# Patient Record
Sex: Female | Born: 1985 | Race: Black or African American | Hispanic: No | Marital: Single | Smoking: Never smoker
Health system: Southern US, Community
[De-identification: ages and names within clinical notes are randomized; demographics above are authoritative.]

## PROBLEM LIST (undated history)

## (undated) DIAGNOSIS — J45909 Unspecified asthma, uncomplicated: Secondary | ICD-10-CM

## (undated) DIAGNOSIS — R7309 Other abnormal glucose: Secondary | ICD-10-CM

## (undated) DIAGNOSIS — G43909 Migraine, unspecified, not intractable, without status migrainosus: Secondary | ICD-10-CM

## (undated) DIAGNOSIS — R945 Abnormal results of liver function studies: Secondary | ICD-10-CM

## (undated) HISTORY — DX: Abnormal results of liver function studies: R94.5

## (undated) HISTORY — PX: WISDOM TOOTH EXTRACTION: SHX21

## (undated) HISTORY — DX: Other abnormal glucose: R73.09

## (undated) HISTORY — DX: Migraine, unspecified, not intractable, without status migrainosus: G43.909

---

## 2005-02-26 ENCOUNTER — Emergency Department (HOSPITAL_COMMUNITY): Admission: EM | Admit: 2005-02-26 | Discharge: 2005-02-26 | Payer: Self-pay | Admitting: Emergency Medicine

## 2006-04-10 ENCOUNTER — Ambulatory Visit: Admission: RE | Admit: 2006-04-10 | Discharge: 2006-04-10 | Payer: Self-pay | Admitting: Emergency Medicine

## 2010-11-06 ENCOUNTER — Other Ambulatory Visit (HOSPITAL_COMMUNITY)
Admission: RE | Admit: 2010-11-06 | Discharge: 2010-11-06 | Disposition: A | Discharge status: Home / Self Care | Payer: Commercial Indemnity | Source: Ambulatory Visit | Attending: Obstetrics and Gynecology | Admitting: Obstetrics and Gynecology

## 2010-11-06 ENCOUNTER — Other Ambulatory Visit (HOSPITAL_COMMUNITY): Payer: Self-pay | Admitting: Obstetrics and Gynecology

## 2010-11-06 DIAGNOSIS — Z01419 Encounter for gynecological examination (general) (routine) without abnormal findings: Secondary | ICD-10-CM | POA: Insufficient documentation

## 2010-11-06 DIAGNOSIS — N926 Irregular menstruation, unspecified: Secondary | ICD-10-CM

## 2010-11-08 ENCOUNTER — Ambulatory Visit (HOSPITAL_COMMUNITY)
Admission: RE | Admit: 2010-11-08 | Discharge: 2010-11-08 | Disposition: A | Discharge status: Home / Self Care | Payer: Commercial Indemnity | Source: Ambulatory Visit | Attending: Obstetrics and Gynecology | Admitting: Obstetrics and Gynecology

## 2010-11-08 DIAGNOSIS — N926 Irregular menstruation, unspecified: Secondary | ICD-10-CM

## 2010-11-08 DIAGNOSIS — N938 Other specified abnormal uterine and vaginal bleeding: Secondary | ICD-10-CM | POA: Insufficient documentation

## 2010-11-08 DIAGNOSIS — N946 Dysmenorrhea, unspecified: Secondary | ICD-10-CM | POA: Insufficient documentation

## 2010-11-08 DIAGNOSIS — N949 Unspecified condition associated with female genital organs and menstrual cycle: Secondary | ICD-10-CM | POA: Insufficient documentation

## 2012-01-28 IMAGING — US US PELVIS COMPLETE
1 series · 14 of 25 positions shown · non-contrast
Comparison: None.

CLINICAL DATA: Dysfunctional uterine bleeding, dysmenorrhea

TRANSABDOMINAL ULTRASOUND OF PELVIS
TECHNIQUE: Transabdominal ultrasound examination of the pelvis was
performed including evaluation of the uterus, ovaries, adnexal
regions, and pelvic cul-de-sac.

[Series 1: us pelvis complete · 14 of 39 slices shown]
[im 1/39]
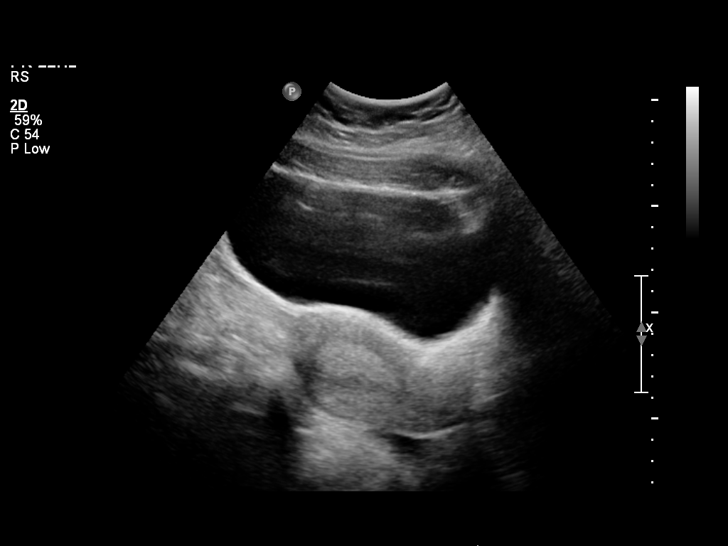
[im 4/39]
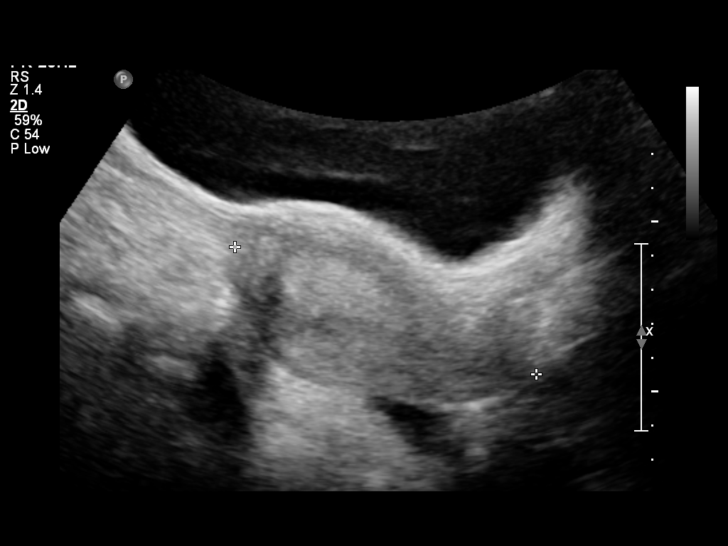
[im 7/39]
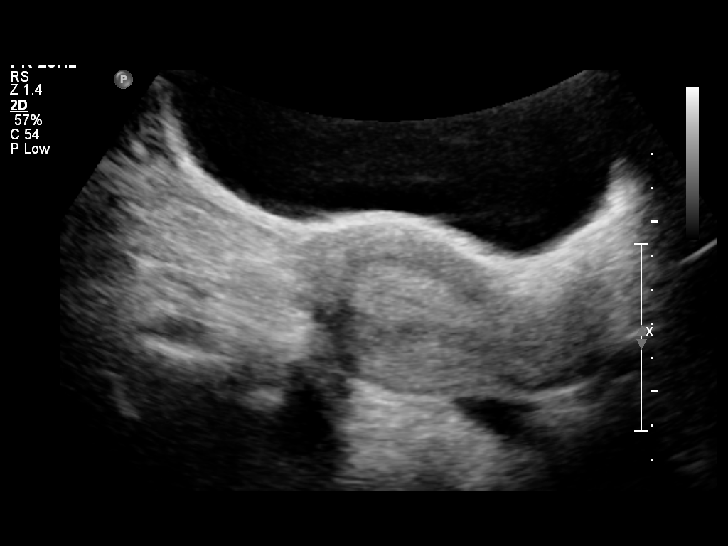
[im 10/39]
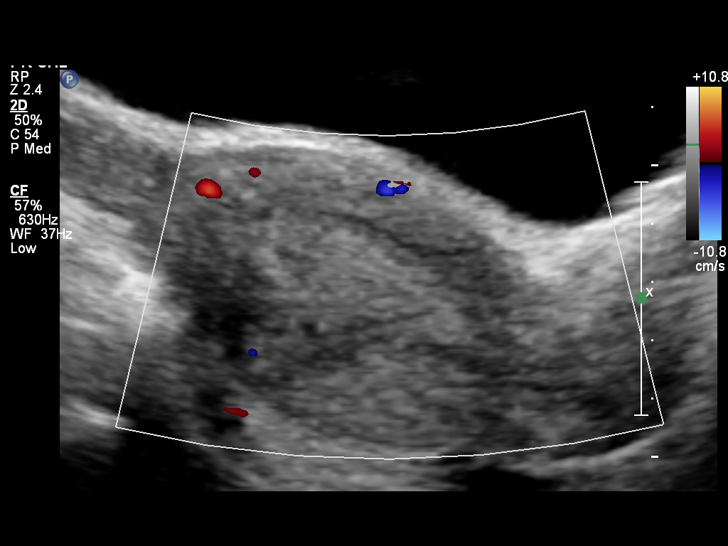
[im 13/39]
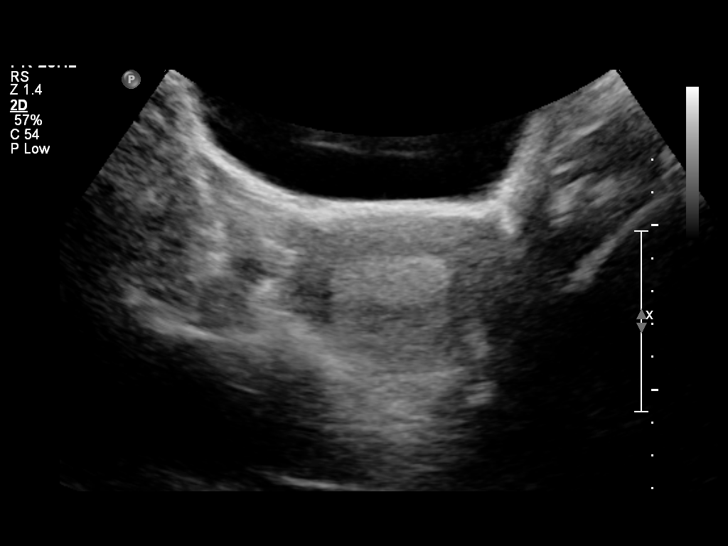
[im 15/39]
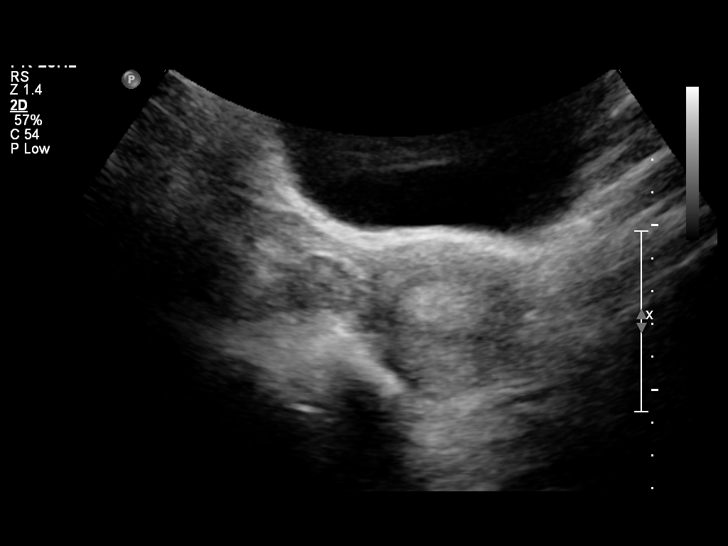
[im 18/39]
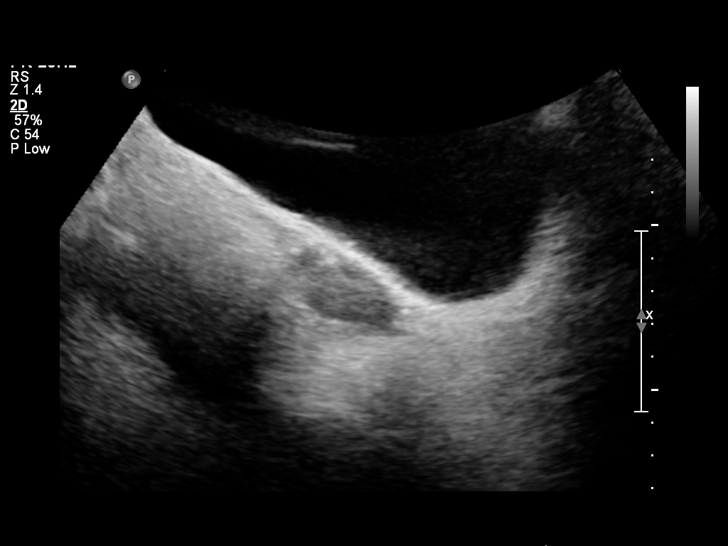
[im 21/39]
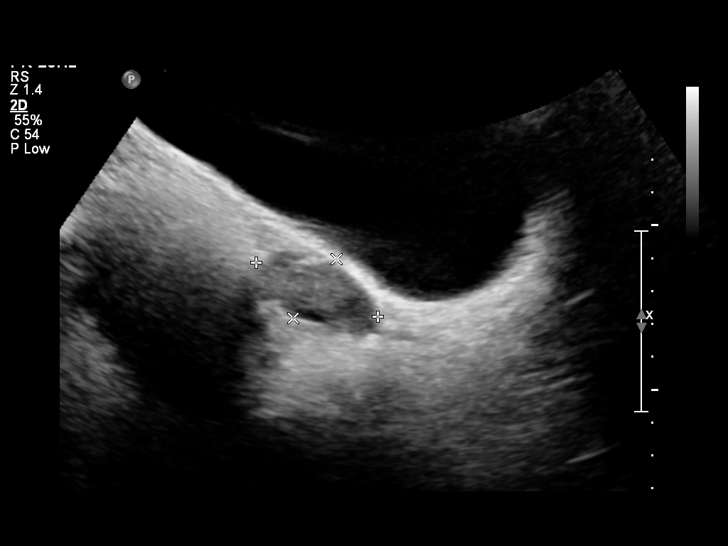
[im 24/39]
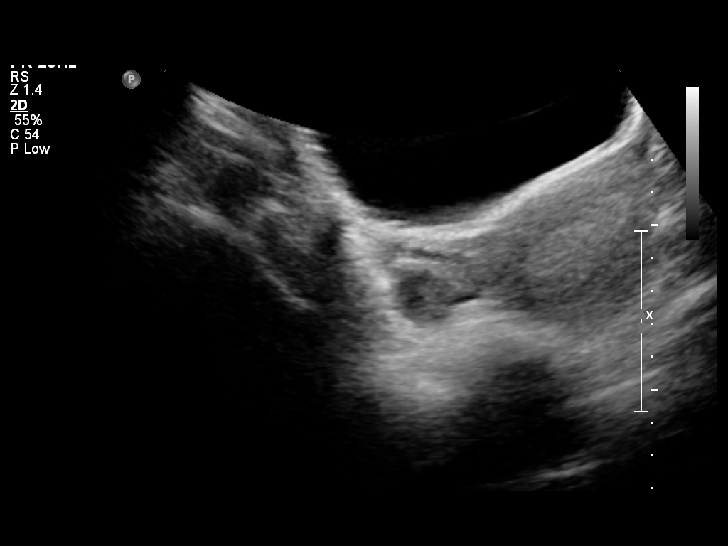
[im 26/39]
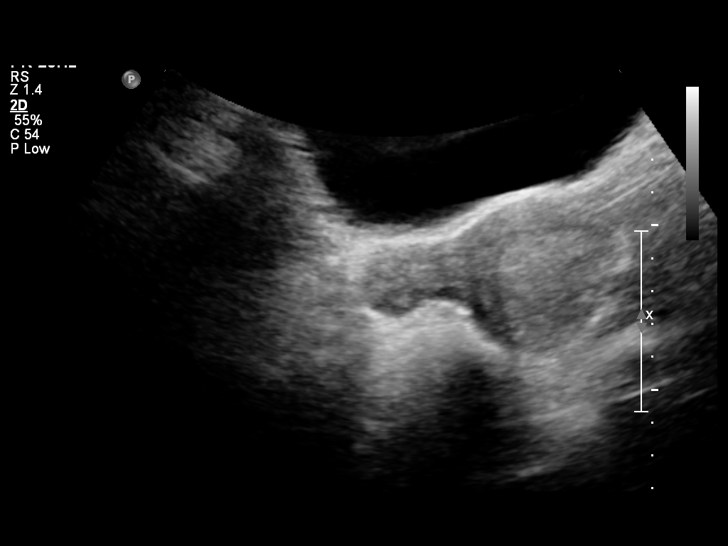
[im 29/39]
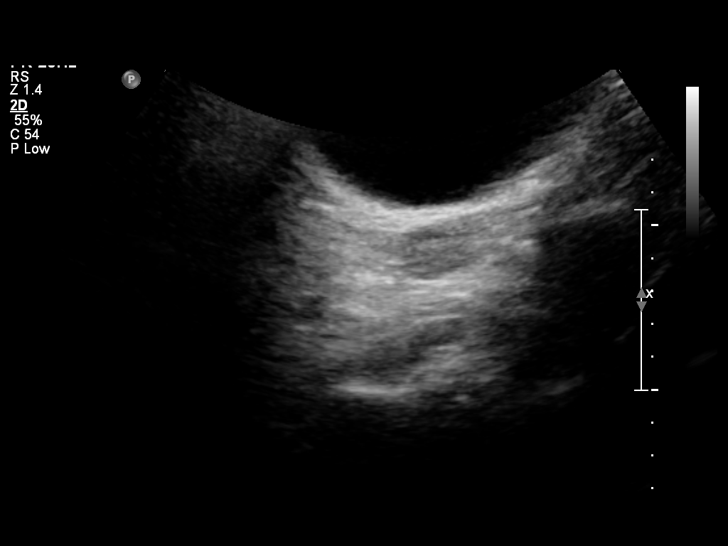
[im 32/39]
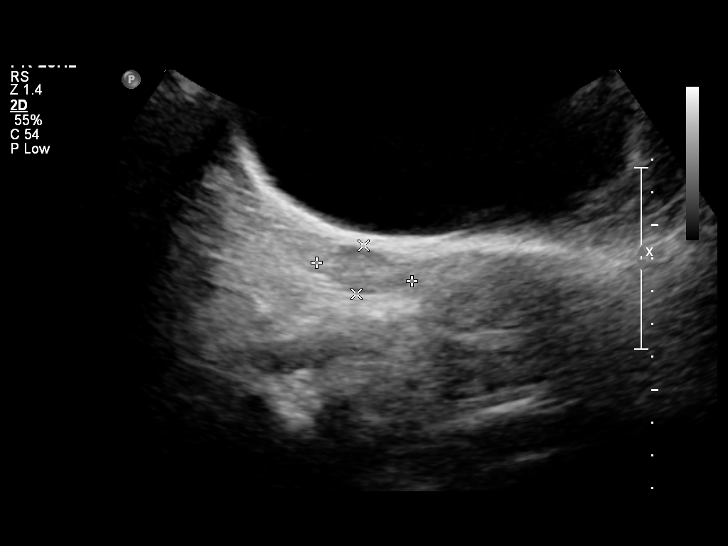
[im 35/39]
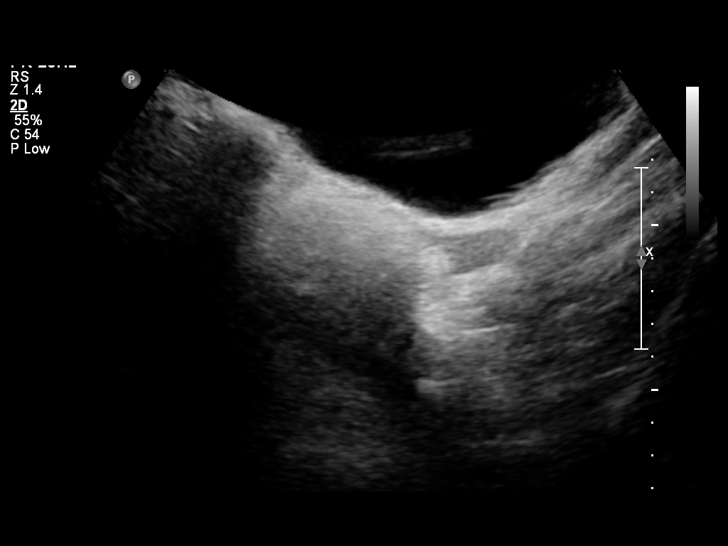
[im 39/39]
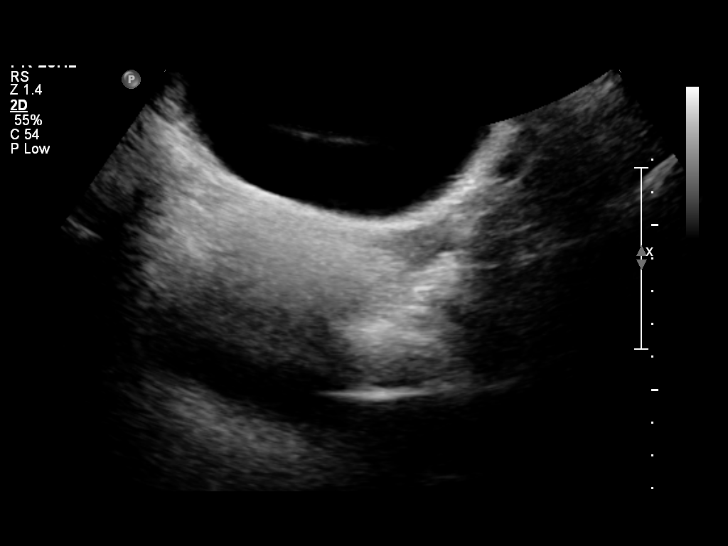

[14 of 25 positions shown; findings below may reference images not displayed]

FINDINGS: Uterus 9.6 x 7.5 x 5.1 cm.  Anteverted, anteflexed.  No focal
abnormality.

Endometrium 1.7 cm.  Uniformly echogenic without focal abnormality.

Right Ovary 4.1 x 3.4 x 2.2 cm.  Normal.

Left Ovary 3.0 x 2.1 x 1.5 cm.  Normal.

Other Findings:  No free fluid.
IMPRESSION: Normal exam.  Transvaginal technique was not used because the
patient is not sexually active.  This could lower the sensitivity
for detection of focal abnormalities in the endometrium.

## 2014-10-20 ENCOUNTER — Encounter: Payer: Self-pay | Admitting: Certified Nurse Midwife

## 2014-10-20 ENCOUNTER — Ambulatory Visit (INDEPENDENT_AMBULATORY_CARE_PROVIDER_SITE_OTHER): Payer: Commercial Indemnity | Admitting: Certified Nurse Midwife

## 2014-10-20 VITALS — BP 124/82 | HR 70 | Resp 20 | Ht 73.5 in | Wt >= 6400 oz

## 2014-10-20 DIAGNOSIS — D649 Anemia, unspecified: Secondary | ICD-10-CM | POA: Diagnosis not present

## 2014-10-20 DIAGNOSIS — Z01419 Encounter for gynecological examination (general) (routine) without abnormal findings: Secondary | ICD-10-CM | POA: Diagnosis not present

## 2014-10-20 DIAGNOSIS — N938 Other specified abnormal uterine and vaginal bleeding: Secondary | ICD-10-CM | POA: Diagnosis not present

## 2014-10-20 DIAGNOSIS — Z124 Encounter for screening for malignant neoplasm of cervix: Secondary | ICD-10-CM

## 2014-10-20 DIAGNOSIS — Z0000 Encounter for general adult medical examination without abnormal findings: Secondary | ICD-10-CM

## 2014-10-20 MED ORDER — FUSION PLUS PO CAPS: 1.0000 | ORAL_CAPSULE | Freq: Two times a day (BID) | ORAL | Status: DC

## 2014-10-20 NOTE — Progress Notes (Signed)
29 y.o. G0P0000 Single  African American Fe here to establish gyn care and for annual exam. Periods have been very irregular in the past year. 2015 periods 1/15,/3/15,5/15, 8/15/, 04-20-14 with bleeding until 08/05/14, heavy to moderate. Started again 08-08-14 and bleeding scant amount  now. Denies fatigue or faintness, drinking good fluid intake daily. Never sexually active. Previous periods all normal, just cramping. Saw PCP in the past year for labs/ aex, per patient all normal. Patient has been trying to lose weight, but feels she has gained weight. Patient is ready for this to stop. Patient has migraine headaches but no migraine with aura.Patient had previously been very athletic to maintain weight, but works in sedentary job now. No other health concerns today.  Patient's last menstrual period was 04/20/2014.          Sexually active: No. The current method of family planning is abstinence.    Exercising: Yes.    walking 2 miles 5 days a week Smoker:  no  Health Maintenance: Pap:  2-85yrs ago neg per patient MMG:  none Colonoscopy:  none BMD:   none TDaP:  Within 8yrs Labs: Hgb-9.6 Self breast exam: done monthly   reports that she has never smoked. She does not have any smokeless tobacco history on file. She reports that she drinks alcohol. She reports that she does not use illicit drugs.  Past Medical History  Diagnosis Date  . Migraines     Past Surgical History  Procedure Laterality Date  . Wisdom tooth extraction      Current Outpatient Prescriptions  Medication Sig Dispense Refill  . Ibuprofen (ADVIL MIGRAINE PO) Take by mouth as needed.    Marland Kitchen UNABLE TO FIND as needed. Sudafed sinus pressure and pain     No current facility-administered medications for this visit.    Family History  Problem Relation Age of Onset  . Pancreatic cancer Mother   . Diabetes Mother   . Hypertension Paternal Grandmother   . Stroke Paternal Grandfather     ROS:  Pertinent items are noted  in HPI.  Otherwise, a comprehensive ROS was negative.  Exam:   BP 124/82 mmHg  Pulse 70  Resp 20  Ht 6' 1.5" (1.867 m)  Wt 409 lb (185.521 kg)  BMI 53.22 kg/m2  LMP 04/20/2014 Height: 6' 1.5" (186.7 cm) Ht Readings from Last 3 Encounters:  10/20/14 6' 1.5" (1.867 m)    General appearance: alert, cooperative and appears stated age Head: Normocephalic, without obvious abnormality, atraumatic Neck: no adenopathy, supple, symmetrical, trachea midline and thyroid normal to inspection and palpation Lungs: clear to auscultation bilaterally Breasts: normal appearance, no masses or tenderness, No nipple retraction or dimpling, No nipple discharge or bleeding, No axillary or supraclavicular adenopathy Heart: regular rate and rhythm Abdomen: soft, non-tender; no masses,  no organomegaly Extremities: extremities normal, atraumatic, no cyanosis or edema Skin: Skin color, texture, turgor normal. No rashes or lesions Lymph nodes: Cervical, supraclavicular, and axillary nodes normal. No abnormal inguinal nodes palpated Neurologic: Grossly normal   Pelvic: External genitalia:  no lesions              Urethra:  normal appearing urethra with no masses, tenderness or lesions              Bartholin's and Skene's: normal                 Vagina: normal appearing vagina with normal color and discharge, no lesions, virginal introitus  Cervix: normal,non tender, no lesions posterior              Pap taken: Yes.   Bimanual Exam:  Uterus: anterior  normal, no masses noted, limited due to body habitus              Adnexa: normal adnexa, no mass, fullness, tenderness and adnexa not palpable due to body habitus, but no large masses felt               Rectovaginal: Confirms               Anus:  normal appearance  Chaperone present: Yes  A:  Well Woman with normal exam  Contraception none needed never sexually active  DUB ? Related to weight change  Anemia probably due to DUB  Morbid  obesity  Limited pelvic exam due to body habitus    P:   Reviewed health and wellness pertinent to exam  Discussed limited pelvic exam but no large masses noted. Discussed DUB can be related to weight change, Thyroid changes or other problems. Would recommend lab for Thyroid and PUS to evaluate for other problems and confirm normal exam. Patient agreeable and is aware she will be called with insurance information and scheduled. Continue to keep menses calendar and advise if having warning signs with bleeding.. Discussed also management of DUB with contraception options of OCP, Nuvaring,IUD, POP.Discussed risk and benefits, expectations, insertion/removal. Patient would open to OCP or POP at this point, but would like to think about. Will discuss further once PUS completed. Questions addressed.  Lab TSH  Discussed anemia finding and need for Rx iron, patient agreeable. Discussed increasing iron foods in diet also and importance of limiting tea, which inhibits absorption., given food list. Take Fusion Plus with Vitamin C source. Questions addressed.  Rx Fusion Plus see order  Lab: Iron ,TIBC, Ferritin ,CBC  Recheck in one month with OV if needed.  Pap smear taken with HPV reflex   counseled on breast self exam, STD prevention, HIV risk factors and prevention, family planning choices, adequate intake of calcium and vitamin D., Discussed diet and exercise to achieve normal weight and prevent other health problems . return annually or prn, as above  12 minutes spent with patient in face to face counseling regarding anemia and management.   An After Visit Summary was printed and given to the patient.

## 2014-10-20 NOTE — Patient Instructions (Addendum)
EXERCISE AND DIET:  We recommended that you start or continue a regular exercise program for good health. Regular exercise means any activity that makes your heart beat faster and makes you sweat.  We recommend exercising at least 30 minutes per day at least 3 days a week, preferably 4 or 5.  We also recommend a diet low in fat and sugar.  Inactivity, poor dietary choices and obesity can cause diabetes, heart attack, stroke, and kidney damage, among others.    ALCOHOL AND SMOKING:  Women should limit their alcohol intake to no more than 7 drinks/beers/glasses of wine (combined, not each!) per week. Moderation of alcohol intake to this level decreases your risk of breast cancer and liver damage. And of course, no recreational drugs are part of a healthy lifestyle.  And absolutely no smoking or even second hand smoke. Most people know smoking can cause heart and lung diseases, but did you know it also contributes to weakening of your bones? Aging of your skin?  Yellowing of your teeth and nails?  CALCIUM AND VITAMIN D:  Adequate intake of calcium and Vitamin D are recommended.  The recommendations for exact amounts of these supplements seem to change often, but generally speaking 600 mg of calcium (either carbonate or citrate) and 800 units of Vitamin D per day seems prudent. Certain women may benefit from higher intake of Vitamin D.  If you are among these women, your doctor will have told you during your visit.    PAP SMEARS:  Pap smears, to check for cervical cancer or precancers,  have traditionally been done yearly, although recent scientific advances have shown that most women can have pap smears less often.  However, every woman still should have a physical exam from her gynecologist every year. It will include a breast check, inspection of the vulva and vagina to check for abnormal growths or skin changes, a visual exam of the cervix, and then an exam to evaluate the size and shape of the uterus and  ovaries.  And after 30 years of age, a rectal exam is indicated to check for rectal cancers. We will also provide age appropriate advice regarding health maintenance, like when you should have certain vaccines, screening for sexually transmitted diseases, bone density testing, colonoscopy, mammograms, etc.   MAMMOGRAMS:  All women over 40 years old should have a yearly mammogram. Many facilities now offer a "3D" mammogram, which may cost around $50 extra out of pocket. If possible,  we recommend you accept the option to have the 3D mammogram performed.  It both reduces the number of women who will be called back for extra views which then turn out to be normal, and it is better than the routine mammogram at detecting truly abnormal areas.    COLONOSCOPY:  Colonoscopy to screen for colon cancer is recommended for all women at age 50.  We know, you hate the idea of the prep.  We agree, BUT, having colon cancer and not knowing it is worse!!  Colon cancer so often starts as a polyp that can be seen and removed at colonscopy, which can quite literally save your life!  And if your first colonoscopy is normal and you have no family history of colon cancer, most women don't have to have it again for 10 years.  Once every ten years, you can do something that may end up saving your life, right?  We will be happy to help you get it scheduled when you are ready.    Be sure to check your insurance coverage so you understand how much it will cost.  It may be covered as a preventative service at no cost, but you should check your particular policyIron Deficiency Anemia Anemia is a condition in which there are less red blood cells or hemoglobin in the blood than normal. Hemoglobin is the part of red blood cells that carries oxygen. Iron deficiency anemia is anemia caused by too little iron. It is the most common type of anemia. It may leave you tired and short of breath. CAUSES   Lack of iron in the diet.  Poor absorption  of iron, as seen with intestinal disorders.  Intestinal bleeding.  Heavy periods. SIGNS AND SYMPTOMS  Mild anemia may not be noticeable. Symptoms may include:  Fatigue.  Headache.  Pale skin.  Weakness.  Tiredness.  Shortness of breath.  Dizziness.  Cold hands and feet.  Fast or irregular heartbeat. DIAGNOSIS  Diagnosis requires a thorough evaluation and physical exam by your health care provider. Blood tests are generally used to confirm iron deficiency anemia. Additional tests may be done to find the underlying cause of your anemia. These may include:  Testing for blood in the stool (fecal occult blood test).  A procedure to see inside the colon and rectum (colonoscopy).  A procedure to see inside the esophagus and stomach (endoscopy). TREATMENT  Iron deficiency anemia is treated by correcting the cause of the deficiency. Treatment may involve:  Adding iron-rich foods to your diet.  Taking iron supplements. Pregnant or breastfeeding women need to take extra iron because their normal diet usually does not provide the required amount.  Taking vitamins. Vitamin C improves the absorption of iron. Your health care provider may recommend that you take your iron tablets with a glass of orange juice or vitamin C supplement.  Medicines to make heavy menstrual flow lighter.  Surgery. HOME CARE INSTRUCTIONS   Take iron as directed by your health care provider.  If you cannot tolerate taking iron supplements by mouth, talk to your health care provider about taking them through a vein (intravenously) or an injection into a muscle.  For the best iron absorption, iron supplements should be taken on an empty stomach. If you cannot tolerate them on an empty stomach, you may need to take them with food.  Do not drink milk or take antacids at the same time as your iron supplements. Milk and antacids may interfere with the absorption of iron.  Iron supplements can cause  constipation. Make sure to include fiber in your diet to prevent constipation. A stool softener may also be recommended.  Take vitamins as directed by your health care provider.  Eat a diet rich in iron. Foods high in iron include liver, lean beef, whole-grain bread, eggs, dried fruit, and dark [place] leafy vegetables. SEEK IMMEDIATE MEDICAL CARE IF:   You faint. If this happens, do not drive. Call your local emergency services (911 in [place] if no other help is available.  You have chest pain.  You feel nauseous or vomit.  You have severe or increased shortness of breath with activity.  You feel weak.  You have a rapid heartbeat.  You have unexplained sweating.  You become light-headed when getting up from a chair or bed. MAKE SURE YOU:   Understand these instructions.  Will watch your condition.  Will get help right away if you are not doing well or get worse. Document Released: 05/11/2000 Document Revised: 05/19/2013 Document Reviewed: 01/19/2013 ExitCare   Patient Information 2015 [employer]. This information is not intended to replace advice given to you by your health care provider. Make sure you discuss any questions you have with your health care provider.  

## 2014-10-21 ENCOUNTER — Telehealth: Payer: Self-pay

## 2014-10-21 LAB — IRON: Iron: 24 ug/dL — ABNORMAL LOW (ref 42–145)

## 2014-10-21 LAB — IPS PAP TEST WITH REFLEX TO HPV

## 2014-10-21 LAB — IBC PANEL
%SAT: 6 % — ABNORMAL LOW (ref 20–55)
TIBC: 419 ug/dL (ref 250–470)
UIBC: 395 ug/dL (ref 125–400)

## 2014-10-21 LAB — CBC
HCT: 31.5 % — ABNORMAL LOW (ref 36.0–46.0)
Hemoglobin: 9.8 g/dL — ABNORMAL LOW (ref 12.0–15.0)
MCH: 25.5 pg — ABNORMAL LOW (ref 26.0–34.0)
MCHC: 31.1 g/dL (ref 30.0–36.0)
MCV: 82 fL (ref 78.0–100.0)
MPV: 9.5 fL (ref 8.6–12.4)
Platelets: 451 10*3/uL — ABNORMAL HIGH (ref 150–400)
RBC: 3.84 MIL/uL — ABNORMAL LOW (ref 3.87–5.11)
RDW: 14.1 % (ref 11.5–15.5)
WBC: 10.1 10*3/uL (ref 4.0–10.5)

## 2014-10-21 LAB — FERRITIN: Ferritin: 11 ng/mL (ref 10–291)

## 2014-10-21 LAB — TSH: TSH: 2.224 u[IU]/mL (ref 0.350–4.500)

## 2014-10-21 NOTE — Telephone Encounter (Signed)
-----   Message from Vickie Mejia, CNM sent at 10/21/2014  7:38 AM EDT ----- Notify patient that Ferritin level is borderline low, but normal Iron level is low at 24, normal 42-145 Saturation level is low at 6 normal 20-55% CBC shows low hgb profile with anemia Take Fusion Plus as prescribed and work on iron foods in diet. TSH is normal no problems that would cause bleeding change. Feel she needs PUS to assure no other problems, she will be contacted by our office with insurance information and scheduled as we talked about if needed.

## 2014-10-21 NOTE — Telephone Encounter (Signed)
lmtcb

## 2014-10-22 ENCOUNTER — Telehealth: Payer: Self-pay

## 2014-10-22 ENCOUNTER — Telehealth: Payer: Self-pay | Admitting: Obstetrics and Gynecology

## 2014-10-22 NOTE — Telephone Encounter (Signed)
Call to patient. Advised of benefit quote received for PUS. °Patient agreeable. Scheduled PUS. °Advised patient of 72 hour cancellation policy and $100 cancellation fee. Patient agreeable. °

## 2014-10-22 NOTE — Addendum Note (Signed)
Addended by: Joeseph AmorFAST, Nahshon Reich L on: 10/22/2014 11:47 AM   Modules accepted: Orders

## 2014-10-22 NOTE — Telephone Encounter (Signed)
lmtcb to give lab results. 

## 2014-10-22 NOTE — Telephone Encounter (Signed)
-----   Message from Verner Choleborah S Leonard, CNM sent at 10/21/2014  5:55 PM EDT ----- Pap smear negative no endos 02

## 2014-10-22 NOTE — Telephone Encounter (Signed)
Patient returned call to reschedule. Original appointment canceled.

## 2014-10-22 NOTE — Telephone Encounter (Signed)
Patient calling back to see if there was an earlier ultrasound appointment time on June 2nd

## 2014-10-22 NOTE — Progress Notes (Signed)
Reviewed personally.  M. Suzanne Abraham Margulies, MD.  

## 2014-10-22 NOTE — Telephone Encounter (Signed)
lmtcb

## 2014-10-22 NOTE — Telephone Encounter (Signed)
Called patient. Left detailed message okay per designated party release form that there are no earlier available appointments on 10/28/14 for ultrasound. Advised if needs to change appointment to please call back as soon as possible due to 72 cancellation requirement. Advised can speak with nurse or Sabrina to reschedule appointment if needed.    cc Cathrine MusterSabrina Franklin

## 2014-10-22 NOTE — Telephone Encounter (Signed)
Left message for patient to call back and reschedule PUS. °

## 2014-10-22 NOTE — Telephone Encounter (Signed)
-----   Message from Deborah S Leonard, CNM sent at 10/21/2014  7:38 AM EDT ----- Notify patient that Ferritin level is borderline low, but normal Iron level is low at 24, normal 42-145 Saturation level is low at 6 normal 20-55% CBC shows low hgb profile with anemia Take Fusion Plus as prescribed and work on iron foods in diet. TSH is normal no problems that would cause bleeding change. Feel she needs PUS to assure no other problems, she will be contacted by our office with insurance information and scheduled as we talked about if needed.  

## 2014-10-26 LAB — HEMOGLOBIN, FINGERSTICK: Hemoglobin, fingerstick: 9.6 g/dL — ABNORMAL LOW (ref 12.0–16.0)

## 2014-10-26 NOTE — Telephone Encounter (Signed)
Call to patient to reschedule PUS. Per message "The person you have called cannot accept calls at this time. We apologize for any inconvenience that this may cause."

## 2014-10-26 NOTE — Telephone Encounter (Signed)
Unable to leave message. Try again. 

## 2014-10-28 ENCOUNTER — Other Ambulatory Visit: Payer: Commercial Indemnity

## 2014-10-28 ENCOUNTER — Other Ambulatory Visit: Payer: Commercial Indemnity | Admitting: Obstetrics and Gynecology

## 2014-10-29 NOTE — Telephone Encounter (Signed)
Pt called back. °

## 2014-10-29 NOTE — Telephone Encounter (Signed)
Left message to call back  

## 2014-10-29 NOTE — Telephone Encounter (Signed)
Left message for call back.

## 2014-11-01 NOTE — Telephone Encounter (Signed)
Patient notified of results by leaving a detailed message on voicemail. See lab

## 2014-11-02 NOTE — Telephone Encounter (Signed)
Left message for patient to call back. Need to reschedule PUS °

## 2014-11-03 NOTE — Telephone Encounter (Signed)
Returned call to patient. Rescheduled PUS.  I reiterated benefit information. Patient agreeable. Advised patient of 72 hour cancellation policy and $100 cancellation fee. Patient agreeable.

## 2014-11-03 NOTE — Telephone Encounter (Signed)
Patient is ready to schedule her PUS appointment.  °

## 2014-11-18 ENCOUNTER — Ambulatory Visit (INDEPENDENT_AMBULATORY_CARE_PROVIDER_SITE_OTHER): Payer: Commercial Indemnity | Admitting: Obstetrics and Gynecology

## 2014-11-18 ENCOUNTER — Ambulatory Visit (INDEPENDENT_AMBULATORY_CARE_PROVIDER_SITE_OTHER): Payer: Commercial Indemnity

## 2014-11-18 ENCOUNTER — Encounter: Payer: Self-pay | Admitting: Obstetrics and Gynecology

## 2014-11-18 ENCOUNTER — Other Ambulatory Visit: Payer: Self-pay | Admitting: Obstetrics and Gynecology

## 2014-11-18 VITALS — BP 122/80 | Ht 73.5 in | Wt >= 6400 oz

## 2014-11-18 DIAGNOSIS — N938 Other specified abnormal uterine and vaginal bleeding: Secondary | ICD-10-CM

## 2014-11-18 DIAGNOSIS — N921 Excessive and frequent menstruation with irregular cycle: Secondary | ICD-10-CM

## 2014-11-18 MED ORDER — NORETHINDRONE 0.35 MG PO TABS: 1.0000 | ORAL_TABLET | Freq: Every day | ORAL | Status: DC

## 2014-11-18 MED ORDER — NORETHINDRONE ACETATE 5 MG PO TABS: 5.0000 mg | ORAL_TABLET | Freq: Every day | ORAL | Status: DC

## 2014-11-18 NOTE — Patient Instructions (Signed)
If you would like, you may attend a free seminar at [facility] regarding weight loss surgery.

## 2014-11-18 NOTE — Progress Notes (Signed)
Patient ID: Vickie Mejia, female   DOB: Jun 22, 1985, 29 y.o.   MRN: [id number]  Subjective  29 y.o. G0P0000  African American female here for pelvic ultrasound for pelvic ultrasound for irregular and heavy menses with anemia.   Skipped menses for months.  Menses returned in November 2015.  Bled from November 2015 to March 2016.  Stopped for a few days and then restarted from March 2016 until now.  Bleeding is clotting 2 - 3 times a day.  No pain.  Denies dizziness, lightheadedness, SOB, or palpitations. Notes weight gain.   Has taken some Advil to control bleeding an pain.  She has not taken Provera or progesterone to date.   Has an appointment for lab visit on July 13 for a recheck of her anemia.  Had normal TSH.  Having migraine headaches.  Does not have aura.  States borderline HTN.  Not sexually active now or in the past.  Interested in future childbearing.   Objective  Pelvic ultrasound images and report reviewed with patient.  Uterus - No masses. EMS - 12.18. No feeder vessels. Ovaries - normal Free fluid - no  Assessment  Menorrhagia with irregular menses. I suspect anovulatory bleeding.  Obesity.  Plan  Discussion of anovulatory bleeding and importance of treatment for prevention of endometrial hyperplasia and endometrial cancer.  Aygestin 5 mg daily for 14 days.  Have withdrawal bleeding.  Then start Micronor after 7th day of cycle.  Instructed in use.  Follow up for recheck of blood counts in about 3 weeks.  Follow up for recheck in 3 months.  Discussed weight loss surgery and free seminars regarding this at [facility].  ____25___ minutes face to face time of which over 50% was spent in counseling.   After visit summary to patient.

## 2014-11-30 ENCOUNTER — Telehealth: Payer: Self-pay | Admitting: *Deleted

## 2014-11-30 MED ORDER — NORETHINDRONE 0.35 MG PO TABS: 1.0000 | ORAL_TABLET | Freq: Every day | ORAL | Status: DC

## 2014-11-30 NOTE — Telephone Encounter (Signed)
[facility] Norethindrone 0.35 mg Last AEX:  10/20/14 with DL Next AEX: No AEX scheduled for 2017 Last MMG (if hormonal medication request): n/a Refill authorized: ?  11/18/14 [facility] Left Message To Call Back to see which pharmacy patient is needing rx sent to.

## 2014-11-30 NOTE — Telephone Encounter (Signed)
Patient called back [facility] in order for her insurance to pay for her prescription. They need a new prescription in order to intiate her being able to receive her rx's from them.  Norethindrone 0.35 mg #3 packs/0 rfs [facility], patient is aware.

## 2014-12-06 ENCOUNTER — Telehealth: Payer: Self-pay | Admitting: Certified Nurse Midwife

## 2014-12-06 NOTE — Telephone Encounter (Signed)
Patient calling to cancel her 1 month recheck with Leota Sauerseborah Leonard, CNM for 12/08/14. She said she will wait to be seen until her 3 month recheck with Dr. Edward JollySilva for 02/16/15.

## 2014-12-07 NOTE — Telephone Encounter (Signed)
I see from her note that she is to evaluate bleeding status with Micronor use. I will put her on my reminder list to make sure she comes in.

## 2014-12-07 NOTE — Telephone Encounter (Signed)
Routed to DL 

## 2014-12-08 ENCOUNTER — Ambulatory Visit: Payer: Commercial Indemnity | Admitting: Certified Nurse Midwife

## 2015-01-19 NOTE — Telephone Encounter (Signed)
yes

## 2015-01-19 NOTE — Telephone Encounter (Signed)
Patient scheduled 02/16/15 with Dr. Edward Jolly. Okay to close encounter?

## 2015-02-16 ENCOUNTER — Ambulatory Visit (INDEPENDENT_AMBULATORY_CARE_PROVIDER_SITE_OTHER): Payer: Commercial Indemnity | Admitting: Obstetrics and Gynecology

## 2015-02-16 ENCOUNTER — Encounter: Payer: Self-pay | Admitting: Obstetrics and Gynecology

## 2015-02-16 VITALS — BP 118/78 | HR 80 | Ht 73.5 in | Wt >= 6400 oz

## 2015-02-16 DIAGNOSIS — N938 Other specified abnormal uterine and vaginal bleeding: Secondary | ICD-10-CM | POA: Diagnosis not present

## 2015-02-16 DIAGNOSIS — D649 Anemia, unspecified: Secondary | ICD-10-CM

## 2015-02-16 DIAGNOSIS — N926 Irregular menstruation, unspecified: Secondary | ICD-10-CM

## 2015-02-16 LAB — CBC
HCT: 40.1 % (ref 36.0–46.0)
Hemoglobin: 12.9 g/dL (ref 12.0–15.0)
MCH: 26.5 pg (ref 26.0–34.0)
MCHC: 32.2 g/dL (ref 30.0–36.0)
MCV: 82.3 fL (ref 78.0–100.0)
MPV: 10 fL (ref 8.6–12.4)
Platelets: 378 10*3/uL (ref 150–400)
RBC: 4.87 MIL/uL (ref 3.87–5.11)
RDW: 16.7 % — ABNORMAL HIGH (ref 11.5–15.5)
WBC: 10.5 10*3/uL (ref 4.0–10.5)

## 2015-02-16 LAB — HEMOGLOBIN, FINGERSTICK: Hemoglobin, fingerstick: 13 g/dL (ref 12.0–16.0)

## 2015-02-16 NOTE — Progress Notes (Signed)
Patient ID: Vickie Mejia, female   DOB: 29-Mar-1986, 29 y.o.   MRN: [id number] GYNECOLOGY  VISIT   HPI: 29 y.o.   Single  African American  female   G0P0000 with Patient's last menstrual period was 01/27/2015 (exact date).   here for 3 month follow-up after starting Micronor for anovulatory bleeding.  Had a normal pelvic ultrasound.  No EMB due to young age and EMS 12.18 mm. Anemia with low iron and ferritin.  Labs drawn today for a recheck of this.   Took Aygestin for 14 days and then started Micronor.  Bled heavily for one week after finished the Aygestin.  When started Micronor, did skip the last week of the pills and had no cycle. Then read the directions and restarted the Micronor properly for the last 2 months.  No cycle in August.  In September had a cycle for 2.5 weeks.  Cycles were 55 days apart.  Has borderline HTN.   Not sexually active.  Has difficulty with pelvic exams.  HGB:  13.0  GYNECOLOGIC HISTORY: Patient's last menstrual period was 01/27/2015 (exact date). Contraception:Abstinence Menopausal hormone therapy: n/a Last mammogram: n/a Last pap smear: 10-20-14 Neg        OB History    Gravida Para Term Preterm AB TAB SAB Ectopic Multiple Living           There are no active problems to display for this patient.   Past Medical History  Diagnosis Date  . Migraines     Past Surgical History  Procedure Laterality Date  . Wisdom tooth extraction      Current Outpatient Prescriptions  Medication Sig Dispense Refill  . BIOTIN FORTE PO Take 1 tablet by mouth daily.    . Ibuprofen (ADVIL MIGRAINE PO) Take by mouth as needed.    . Iron-FA-B Cmp-C-Biot-Probiotic (FUSION PLUS) CAPS Take 1 capsule by mouth 2 (two) times daily. 60 capsule 2  . Multiple Vitamins-Minerals (CENTRUM ULTRA WOMENS PO) Take 1 tablet by mouth daily.    . norethindrone (AYGESTIN) 5 MG tablet Take 1 tablet (5 mg total) by mouth daily. Take for 14 days.  Expect a  period when you stop it. 14 tablet 0  . norethindrone (MICRONOR,CAMILA,ERRIN) 0.35 MG tablet Take 1 tablet (0.35 mg total) by mouth daily. 3 Package 0   No current facility-administered medications for this visit.     ALLERGIES: Sulfa antibiotics  Family History  Problem Relation Age of Onset  . Pancreatic cancer Mother   . Diabetes Mother   . Hypertension Paternal Grandmother   . Stroke Paternal Grandfather     Social History   Social History  . Marital Status: Single    Spouse Name: N/A  . Number of Children: N/A  . Years of Education: N/A   Occupational History  . Not on file.   Social History Main Topics  . Smoking status: Never Smoker   . Smokeless tobacco: Never Used  . Alcohol Use: 0.0 - 0.6 oz/week    0-1 Standard drinks or equivalent per week  . Drug Use: No  . Sexual Activity: No     Comment: never sexually active   Other Topics Concern  . Not on file   Social History Narrative    ROS:  Pertinent items are noted in HPI.  PHYSICAL EXAMINATION:    BP 118/78 mmHg  Pulse 80  Ht 6' 1.5" (1.867 m)  Wt 405 lb 12.8 oz (  184.07 kg)  BMI 52.81 kg/m2  LMP 01/27/2015 (Exact Date)    General appearance: alert, cooperative and appears stated age   ASSESSMENT  Menometrorrhagia.  Anovulatory bleeding.  On Micronor.   PLAN  Counseled regarding anovulatory bleeding and the importance of treatment short term and long term to avoid uncontrolled cycles, anemia, hyperplasia and cancer.  Discussed progesterone forms of therapy - other progesterone agents which could include cyclic therapy, Nexplanon, Mirena IUD.  I would favor cyclic progesterone if needed. Will keep bleeding calendar.  Knows to call if cycles are too often, too heavy, or prolonged.  May then consider EMB.  Discussed with patient.  Will continue with Micronor and recheck in 4 months.  Labs drawn today to recheck CBC, iron, and ferritin.  Questions invited and answered.   An After Visit  Summary was printed and given to the patient.  ___15___ minutes face to face time of which over 50% was spent in counseling.

## 2015-02-17 ENCOUNTER — Other Ambulatory Visit: Payer: Self-pay | Admitting: Obstetrics and Gynecology

## 2015-02-17 DIAGNOSIS — D509 Iron deficiency anemia, unspecified: Secondary | ICD-10-CM

## 2015-02-17 LAB — IRON: Iron: 31 ug/dL — ABNORMAL LOW (ref 40–190)

## 2015-02-17 LAB — FERRITIN: Ferritin: 38 ng/mL (ref 10–291)

## 2015-03-07 ENCOUNTER — Other Ambulatory Visit: Payer: Self-pay | Admitting: Obstetrics and Gynecology

## 2015-03-07 ENCOUNTER — Telehealth: Payer: Self-pay

## 2015-03-07 MED ORDER — NORETHINDRONE 0.35 MG PO TABS: 1.0000 | ORAL_TABLET | Freq: Every day | ORAL | Status: DC

## 2015-03-07 NOTE — Telephone Encounter (Signed)
-----   Message from Patton Salles, MD sent at 02/17/2015  7:17 PM EDT ----- Results to patient through My Chart. Recommendation to continue on iron therapy.  Will come in for a recheck in 4 months and have blood work then.  Orders placed.

## 2015-03-07 NOTE — Telephone Encounter (Signed)
Spoke with patient to discuss lab results because she hasn't reviewed her MyChart message regarding low iron level.  Advised patient to continue with iron therapy and will recheck on follow up 06-15-15.  Patient thanked me for calling her and states needs refill on Micronor to [facility].  Advised patient we will take care of this for her.  Routed to Dr. Edward Jolly for refill.

## 2015-03-07 NOTE — Telephone Encounter (Signed)
Ok to refill Micronor for 3 more months.  Needs appointment for a recheck in January 2017 with me.

## 2015-03-07 NOTE — Telephone Encounter (Signed)
Order placed to [place] home delivery. 3 months per Dr. Edward Jolly.  Recheck with Dr. Edward Jolly schedueld for 06/15/15.  Detailed message left okay per designated party release form.

## 2015-06-15 ENCOUNTER — Encounter: Payer: Self-pay | Admitting: Obstetrics and Gynecology

## 2015-06-15 ENCOUNTER — Ambulatory Visit (INDEPENDENT_AMBULATORY_CARE_PROVIDER_SITE_OTHER): Payer: 59 | Admitting: Obstetrics and Gynecology

## 2015-06-15 VITALS — BP 124/80 | HR 70 | Resp 18 | Ht 73.5 in | Wt >= 6400 oz

## 2015-06-15 DIAGNOSIS — Z3009 Encounter for other general counseling and advice on contraception: Secondary | ICD-10-CM

## 2015-06-15 MED ORDER — NORETHINDRONE 0.35 MG PO TABS: 1.0000 | ORAL_TABLET | Freq: Every day | ORAL | Status: DC

## 2015-06-15 NOTE — Progress Notes (Signed)
GYNECOLOGY  VISIT   HPI: 30 y.o.   Single  African American  female   G0P0000 with Patient's last menstrual period was 05/26/2015.   here for   Follow up on irregular periods Taking Micronor for anovulatory bleeding.  Missed one day only of pills. Had menses in November and December 2016.  Not time yet for cycle this month, January 2017.  Menses heavy for first few days, then lighten.  Last a total of 6 - 7 days. No real significant cramping.  Some headaches when menses are about to start.  Feels ok on this pill.   Notes her eczema may be worse.   Sees dermatology.   Had pelvic ultrasound normal.  No EMB due to young age.   Has random symptoms:  Eye twitch.  Has HTN.  GYNECOLOGIC HISTORY: Patient's last menstrual period was 05/26/2015. Contraception:Micronor Not sexually active ever.  Menopausal hormone therapy: None Last mammogram: None Last pap smear: 10/20/2014 WNL No abnormal paps        OB History    Gravida Para Term Preterm AB TAB SAB Ectopic Multiple Living           There are no active problems to display for this patient.   Past Medical History  Diagnosis Date  . Migraines     Past Surgical History  Procedure Laterality Date  . Wisdom tooth extraction      Current Outpatient Prescriptions  Medication Sig Dispense Refill  . BIOTIN FORTE PO Take 1 tablet by mouth daily.    . Ibuprofen (ADVIL MIGRAINE PO) Take by mouth as needed.    . Multiple Vitamins-Minerals (CENTRUM ULTRA WOMENS PO) Take 1 tablet by mouth daily.    . norethindrone (MICRONOR,CAMILA,ERRIN) 0.35 MG tablet Take 1 tablet (0.35 mg total) by mouth daily. 3 Package 0  . Iron-FA-B Cmp-C-Biot-Probiotic (FUSION PLUS) CAPS Take 1 capsule by mouth 2 (two) times daily. (Patient not taking: Reported on 06/15/2015) 60 capsule 2  . norethindrone (AYGESTIN) 5 MG tablet Take 1 tablet (5 mg total) by mouth daily. Take for 14 days.  Expect a period when you stop it. (Patient not  taking: Reported on 06/15/2015) 14 tablet 0   No current facility-administered medications for this visit.     ALLERGIES: Sulfa antibiotics  Family History  Problem Relation Age of Onset  . Pancreatic cancer Mother   . Diabetes Mother   . Hypertension Paternal Grandmother   . Stroke Paternal Grandfather     Social History   Social History  . Marital Status: Single    Spouse Name: N/A  . Number of Children: N/A  . Years of Education: N/A   Occupational History  . Not on file.   Social History Main Topics  . Smoking status: Never Smoker   . Smokeless tobacco: Never Used  . Alcohol Use: 0.0 - 0.6 oz/week    0-1 Standard drinks or equivalent per week  . Drug Use: No  . Sexual Activity: No     Comment: never sexually active   Other Topics Concern  . Not on file   Social History Narrative    ROS:  Pertinent items are noted in HPI.  PHYSICAL EXAMINATION:    BP 124/80 mmHg  Pulse 70  Resp 18  Ht 6' 1.5" (1.867 m)  Wt 415 lb (188.243 kg)  BMI 54.00 kg/m2  LMP 05/26/2015    General appearance: alert, cooperative and appears stated age  ASSESSMENT  Surveillance of progesterone only OCPs.  Doing well with regular cycles.  PLAN  Discussion of progesterone only OCPs.  Risks and benefits.  Patient will continue.  Refilled another 3 packs.  Should have enough to make it through to her annual exam in May 2017.  Discussed progesterone IUDs as an alternative.  Patient declines.  Never sexually active.  An After Visit Summary was printed and given to the patient.  __15____ minutes face to face time of which over 50% was spent in counseling.

## 2015-07-04 ENCOUNTER — Other Ambulatory Visit: Payer: Self-pay | Admitting: Obstetrics and Gynecology

## 2015-07-04 MED ORDER — NORETHINDRONE 0.35 MG PO TABS: 1.0000 | ORAL_TABLET | Freq: Every day | ORAL | Status: DC

## 2015-07-04 NOTE — Telephone Encounter (Signed)
Sent refill to [facility]- eh

## 2015-07-04 NOTE — Telephone Encounter (Signed)
Patient is needing her Arna Medici Be sent to [facility] instead of [facility]. Best # to reach: [phone]

## 2015-07-11 ENCOUNTER — Other Ambulatory Visit: Payer: Self-pay | Admitting: Obstetrics and Gynecology

## 2015-07-12 ENCOUNTER — Other Ambulatory Visit: Payer: Self-pay | Admitting: *Deleted

## 2015-07-12 MED ORDER — NORETHINDRONE 0.35 MG PO TABS: 1.0000 | ORAL_TABLET | Freq: Every day | ORAL | Status: DC

## 2015-07-12 NOTE — Telephone Encounter (Signed)
Patient calling to check the status of a refill request for her birth control.

## 2015-07-12 NOTE — Telephone Encounter (Signed)
I spoke with patient and her last refill was sent by me to [facility] instead of [facility]. I apologized to the patient ans corrected this. I also sent her a 30 supply to her local pharmacy until her mail in order came in. eh

## 2015-08-23 ENCOUNTER — Emergency Department (HOSPITAL_COMMUNITY)
Admission: EM | Admit: 2015-08-23 | Discharge: 2015-08-23 | Disposition: A | Discharge status: Home / Self Care | Payer: Managed Care, Other (non HMO) | Attending: Emergency Medicine | Admitting: Emergency Medicine

## 2015-08-23 ENCOUNTER — Encounter (HOSPITAL_COMMUNITY): Payer: Self-pay | Admitting: *Deleted

## 2015-08-23 DIAGNOSIS — Z79899 Other long term (current) drug therapy: Secondary | ICD-10-CM | POA: Insufficient documentation

## 2015-08-23 DIAGNOSIS — Z8679 Personal history of other diseases of the circulatory system: Secondary | ICD-10-CM | POA: Diagnosis not present

## 2015-08-23 DIAGNOSIS — R1110 Vomiting, unspecified: Secondary | ICD-10-CM | POA: Diagnosis present

## 2015-08-23 DIAGNOSIS — R112 Nausea with vomiting, unspecified: Secondary | ICD-10-CM | POA: Diagnosis not present

## 2015-08-23 DIAGNOSIS — J45909 Unspecified asthma, uncomplicated: Secondary | ICD-10-CM | POA: Insufficient documentation

## 2015-08-23 HISTORY — DX: Unspecified asthma, uncomplicated: J45.909

## 2015-08-23 LAB — LIPASE, BLOOD: Lipase: 25 U/L (ref 11–51)

## 2015-08-23 LAB — COMPREHENSIVE METABOLIC PANEL
ALT: 36 U/L (ref 14–54)
AST: 31 U/L (ref 15–41)
Albumin: 4.2 g/dL (ref 3.5–5.0)
Alkaline Phosphatase: 51 U/L (ref 38–126)
Anion gap: 7 (ref 5–15)
BUN: 8 mg/dL (ref 6–20)
CO2: 27 mmol/L (ref 22–32)
Calcium: 9.5 mg/dL (ref 8.9–10.3)
Chloride: 109 mmol/L (ref 101–111)
Creatinine, Ser: 0.78 mg/dL (ref 0.44–1.00)
GFR calc Af Amer: >60 mL/min (ref >60)
GFR calc non Af Amer: >60 mL/min (ref >60)
Glucose, Bld: 111 mg/dL — ABNORMAL HIGH (ref 65–99)
Potassium: 3.6 mmol/L (ref 3.5–5.1)
Sodium: 143 mmol/L (ref 135–145)
Total Bilirubin: 0.4 mg/dL (ref 0.3–1.2)
Total Protein: 7.8 g/dL (ref 6.5–8.1)

## 2015-08-23 LAB — CBC
HCT: 41 % (ref 36.0–46.0)
Hemoglobin: 13.5 g/dL (ref 12.0–15.0)
MCH: 28.4 pg (ref 26.0–34.0)
MCHC: 32.9 g/dL (ref 30.0–36.0)
MCV: 86.3 fL (ref 78.0–100.0)
Platelets: 375 10*3/uL (ref 150–400)
RBC: 4.75 MIL/uL (ref 3.87–5.11)
RDW: 14 % (ref 11.5–15.5)
WBC: 9.8 10*3/uL (ref 4.0–10.5)

## 2015-08-23 MED ORDER — SODIUM CHLORIDE 0.9 % IV BOLUS (SEPSIS)
1000.0000 mL | Freq: Once | INTRAVENOUS | Status: AC
  Administered 2015-08-23: 1000 mL via INTRAVENOUS

## 2015-08-23 MED ORDER — ONDANSETRON HCL 4 MG PO TABS: 4.0000 mg | ORAL_TABLET | Freq: Four times a day (QID) | ORAL | Status: DC

## 2015-08-23 NOTE — Discharge Instructions (Signed)
Nausea and Vomiting  Nausea means you feel sick to your stomach. Throwing up (vomiting) is a reflex where stomach contents come out of your mouth.  HOME CARE   · Take medicine as told by your doctor.  · Do not force yourself to eat. However, you do need to drink fluids.  · If you feel like eating, eat a normal diet as told by your doctor.    Eat rice, wheat, potatoes, bread, lean meats, yogurt, fruits, and vegetables.    Avoid high-fat foods.  · Drink enough fluids to keep your pee (urine) clear or pale yellow.  · Ask your doctor how to replace body fluid losses (rehydrate). Signs of body fluid loss (dehydration) include:    Feeling very thirsty.    Dry lips and mouth.    Feeling dizzy.    Dark pee.    Peeing less than normal.    Feeling confused.    Fast breathing or heart rate.  GET HELP RIGHT AWAY IF:   · You have blood in your throw up.  · You have black or bloody poop (stool).  · You have a bad headache or stiff neck.  · You feel confused.  · You have bad belly (abdominal) pain.  · You have chest pain or trouble breathing.  · You do not pee at least once every 8 hours.  · You have cold, clammy skin.  · You keep throwing up after 24 to 48 hours.  · You have a fever.  MAKE SURE YOU:   · Understand these instructions.  · Will watch your condition.  · Will get help right away if you are not doing well or get worse.     This information is not intended to replace advice given to you by your health care provider. Make sure you discuss any questions you have with your health care provider.     Document Released: 10/31/2007 Document Revised: 08/06/2011 Document Reviewed: 10/13/2010  Elsevier Interactive Patient Education ©2016 Elsevier Inc[employer]

## 2015-08-23 NOTE — ED Notes (Signed)
Per PTAR, pt from work, reports n/v, vomited x 2 while at work , was diaphoretic.

## 2015-08-23 NOTE — Progress Notes (Signed)
[facility] ED CM noted pt with coverage but no pcp listed [facility] ED CM spoke with pt on how to obtain an in network pcp with insurance coverage via the customer service number or web site  Cm reviewed ED level of care for crisis/emergent services and community pcp level of care to manage continuous or chronic medical concerns.  The pt voiced understanding CM encouraged pt and discussed pt's responsibility to verify with pt's insurance carrier that any recommended medical provider offered by any emergency room or a hospital provider is within the carrier's network. The pt voiced understanding   

## 2015-08-23 NOTE — ED Provider Notes (Signed)
CSN: [id number]     Arrival date & time 08/23/15  1126 History   First MD Initiated Contact with Patient 08/23/15 1214     Chief Complaint  Patient presents with  . Emesis    HPI Pt woke up this am and she felt fine.  Pt was at work today and had two episodes of vomiting.  After it occurred she kept on gagging.  She started to feel very flushed and hot but also chilled at the same time.  She developed a tightness in her chest after the vomiting.  She felt like her food was sitting in her chest and she had indigestion.  She had some [employer] about 5 minutes before the episode.   Sx are improving.  No diarrhea.  No fevers. No abdominal pain.  No syncope.  No dysuria.   Past Medical History  Diagnosis Date  . Migraines   . Asthma    Past Surgical History  Procedure Laterality Date  . Wisdom tooth extraction     Family History  Problem Relation Age of Onset  . Pancreatic cancer Mother   . Diabetes Mother   . Hypertension Paternal Grandmother   . Stroke Paternal Grandfather    Social History  Substance Use Topics  . Smoking status: Never Smoker   . Smokeless tobacco: Never Used  . Alcohol Use: 0.0 - 0.6 oz/week    0-1 Standard drinks or equivalent per week   OB History    Gravida Para Term Preterm AB TAB SAB Ectopic Multiple Living       Review of Systems  All other systems reviewed and are negative.     Allergies  Sulfa antibiotics and Coconut oil  Home Medications   Prior to Admission medications   Medication Sig Start Date End Date Taking? Authorizing Provider  Multiple Vitamins-Minerals (CENTRUM ULTRA WOMENS PO) Take 1 tablet by mouth daily.   Yes Historical Provider, MD  norethindrone (NORA-BE) 0.35 MG tablet Take 1 tablet by mouth daily.   Yes Historical Provider, MD  Iron-FA-B Cmp-C-Biot-Probiotic (FUSION PLUS) CAPS Take 1 capsule by mouth 2 (two) times daily. Patient not taking: Reported on 06/15/2015 10/20/14   Verner Chol, CNM   norethindrone (AYGESTIN) 5 MG tablet Take 1 tablet (5 mg total) by mouth daily. Take for 14 days.  Expect a period when you stop it. Patient not taking: Reported on 06/15/2015 11/18/14   Patton Salles, MD  norethindrone (MICRONOR,CAMILA,ERRIN) 0.35 MG tablet Take 1 tablet (0.35 mg total) by mouth daily. Patient not taking: Reported on 08/23/2015 07/12/15   Patton Salles, MD  ondansetron (ZOFRAN) 4 MG tablet Take 1 tablet (4 mg total) by mouth every 6 (six) hours. 08/23/15   Linwood Dibbles, MD   BP 153/101 mmHg  Pulse 90  Temp(Src) 98.6 F (37 C) (Oral)  Resp 17  SpO2 98% Physical Exam  Constitutional: She appears well-developed and well-nourished. No distress.  HENT:  Head: Normocephalic and atraumatic.  Right Ear: External ear normal.  Left Ear: External ear normal.  Eyes: Conjunctivae are normal. Right eye exhibits no discharge. Left eye exhibits no discharge. No scleral icterus.  Neck: Neck supple. No tracheal deviation present.  Cardiovascular: Normal rate, regular rhythm and intact distal pulses.   Pulmonary/Chest: Effort normal and breath sounds normal. No stridor. No respiratory distress. She has no wheezes. She has no rales.  Abdominal: Soft. Bowel sounds are normal.  She exhibits no distension. There is no tenderness. There is no rebound and no guarding.  Musculoskeletal: She exhibits no edema or tenderness.  Neurological: She is alert. She has normal strength. No cranial nerve deficit (no facial droop, extraocular movements intact, no slurred speech) or sensory deficit. She exhibits normal muscle tone. She displays no seizure activity. Coordination normal.  Skin: Skin is warm and dry. No rash noted.  Psychiatric: She has a normal mood and affect.  Nursing note and vitals reviewed.   ED Course  Procedures (including critical care time) Labs Review Labs Reviewed  COMPREHENSIVE METABOLIC PANEL - Abnormal; Notable for the following:    Glucose, Bld 111 (*)     All other components within normal limits  LIPASE, BLOOD  CBC  URINALYSIS, ROUTINE W REFLEX MICROSCOPIC ([facility])  POC URINE PREG, ED    Medications  sodium chloride 0.9 % bolus 1,000 mL (1,000 mLs Intravenous New Bag/Given 08/23/15 1305)     MDM   Final diagnoses:  Non-intractable vomiting with nausea, vomiting of unspecified type   Patient has no further episodes of vomiting. She is feeling better. Laboratory tests are reassuring. Patient was not able to give a urine sample in the emergency room. I doubt urinary tract infection or pyelonephritis.  Patient is stable for discharge. We discussed returning if she starts having fever pain or persistent vomiting.     Linwood DibblesJon Sladen Plancarte, MD 08/23/15 [phone]

## 2015-08-23 NOTE — ED Notes (Signed)
Patient refused in and out Cath.  She attempted several times to urinate but unsuccessful.

## 2015-08-23 NOTE — ED Notes (Addendum)
Pt. Stated that she could not urinate at this time. Will collect urine when pt. Voids. Nurse aware. 

## 2015-09-14 ENCOUNTER — Other Ambulatory Visit: Payer: Self-pay | Admitting: Obstetrics and Gynecology

## 2015-09-14 NOTE — Telephone Encounter (Signed)
Patient is taking heather BC she will run out at the end of May is needing 1 more pack to last her until her appointment.

## 2015-09-14 NOTE — Telephone Encounter (Signed)
Tried calling patient, no answer, left VM to return call.

## 2015-09-14 NOTE — Telephone Encounter (Signed)
Medication refill request: Vickie Mejia (Norethindrone) 0.35mg  Last AEX:  06/15/15 BS Next AEX: 11/23/15 Last MMG (if hormonal medication request): Never Refill authorized: 07/12/15 #1pack w/0 refills. Patient reported 08/23/15 not taking. Today please advise  Will call patient to verify that she is not taking.

## 2015-09-15 NOTE — Telephone Encounter (Signed)
Patient aware that rx has been sent in

## 2015-10-27 DIAGNOSIS — R7989 Other specified abnormal findings of blood chemistry: Secondary | ICD-10-CM

## 2015-10-27 DIAGNOSIS — R7309 Other abnormal glucose: Secondary | ICD-10-CM

## 2015-10-27 HISTORY — DX: Other specified abnormal findings of blood chemistry: R79.89

## 2015-10-27 HISTORY — DX: Other abnormal glucose: R73.09

## 2015-11-23 ENCOUNTER — Ambulatory Visit (INDEPENDENT_AMBULATORY_CARE_PROVIDER_SITE_OTHER): Payer: 59 | Admitting: Obstetrics and Gynecology

## 2015-11-23 ENCOUNTER — Encounter: Payer: Self-pay | Admitting: Obstetrics and Gynecology

## 2015-11-23 VITALS — BP 122/76 | HR 80 | Resp 18 | Ht 73.5 in | Wt >= 6400 oz

## 2015-11-23 DIAGNOSIS — Z01419 Encounter for gynecological examination (general) (routine) without abnormal findings: Secondary | ICD-10-CM | POA: Diagnosis not present

## 2015-11-23 DIAGNOSIS — Z0000 Encounter for general adult medical examination without abnormal findings: Secondary | ICD-10-CM | POA: Diagnosis not present

## 2015-11-23 LAB — COMPREHENSIVE METABOLIC PANEL
ALBUMIN: 4 g/dL (ref 3.6–5.1)
ALK PHOS: 53 U/L (ref 33–115)
ALT: 33 U/L — AB (ref 6–29)
AST: 32 U/L — AB (ref 10–30)
BILIRUBIN TOTAL: 0.4 mg/dL (ref 0.2–1.2)
BUN: 6 mg/dL — ABNORMAL LOW (ref 7–25)
CALCIUM: 9.2 mg/dL (ref 8.6–10.2)
CO2: 24 mmol/L (ref 20–31)
CREATININE: 0.71 mg/dL (ref 0.50–1.10)
Chloride: 104 mmol/L (ref 98–110)
Glucose, Bld: 93 mg/dL (ref 65–99)
Potassium: 3.9 mmol/L (ref 3.5–5.3)
SODIUM: 140 mmol/L (ref 135–146)
Total Protein: 6.9 g/dL (ref 6.1–8.1)

## 2015-11-23 LAB — LIPID PANEL
CHOL/HDL RATIO: 4.2 ratio (ref <=5.0)
CHOLESTEROL: 200 mg/dL (ref 125–200)
HDL: 48 mg/dL (ref >=46)
LDL Cholesterol: 132 mg/dL — ABNORMAL HIGH (ref <130)
TRIGLYCERIDES: 101 mg/dL (ref <150)
VLDL: 20 mg/dL (ref <30)

## 2015-11-23 LAB — THYROID PANEL WITH TSH
Free Thyroxine Index: 2.5 (ref 1.4–3.8)
T3 Uptake: 28 % (ref 22–35)
T4 TOTAL: 9 ug/dL (ref 4.5–12.0)
TSH: 1.82 m[IU]/L

## 2015-11-23 LAB — CBC
HEMATOCRIT: 41.9 % (ref 35.0–45.0)
HEMOGLOBIN: 13.8 g/dL (ref 11.7–15.5)
MCH: 28.7 pg (ref 27.0–33.0)
MCHC: 32.9 g/dL (ref 32.0–36.0)
MCV: 87.1 fL (ref 80.0–100.0)
MPV: 10.3 fL (ref 7.5–12.5)
Platelets: 393 10*3/uL (ref 140–400)
RBC: 4.81 MIL/uL (ref 3.80–5.10)
RDW: 14.2 % (ref 11.0–15.0)
WBC: 9.9 10*3/uL (ref 3.8–10.8)

## 2015-11-23 LAB — HEMOGLOBIN, FINGERSTICK: HEMOGLOBIN, FINGERSTICK: 13.7 g/dL (ref 12.0–16.0)

## 2015-11-23 MED ORDER — NORETHINDRONE 0.35 MG PO TABS
1.0000 | ORAL_TABLET | Freq: Every day | ORAL | Status: DC
Start: 2015-11-23 — End: 2016-11-21

## 2015-11-23 NOTE — Patient Instructions (Signed)
Health Maintenance, Female Adopting a healthy lifestyle and getting preventive care can go a long way to promote health and wellness. Talk with your health care provider about what schedule of regular examinations is right for you. This is a good chance for you to check in with your provider about disease prevention and staying healthy. In between checkups, there are plenty of things you can do on your own. Experts have done a lot of research about which lifestyle changes and preventive measures are most likely to keep you healthy. Ask your health care provider for more information. WEIGHT AND DIET  Eat a healthy diet  Be sure to include plenty of vegetables, fruits, low-fat dairy products, and lean protein.  Do not eat a lot of foods high in solid fats, added sugars, or salt.  Get regular exercise. This is one of the most important things you can do for your health.  Most adults should exercise for at least 150 minutes each week. The exercise should increase your heart rate and make you sweat (moderate-intensity exercise).  Most adults should also do strengthening exercises at least twice a week. This is in addition to the moderate-intensity exercise.  Maintain a healthy weight  Body mass index (BMI) is a measurement that can be used to identify possible weight problems. It estimates body fat based on height and weight. Your health care provider can help determine your BMI and help you achieve or maintain a healthy weight.  For females 20 years of age and older:   A BMI below 18.5 is considered underweight.  A BMI of 18.5 to 24.9 is normal.  A BMI of 25 to 29.9 is considered overweight.  A BMI of 30 and above is considered obese.  Watch levels of cholesterol and blood lipids  You should start having your blood tested for lipids and cholesterol at 31 years of age, then have this test every 5 years.  You may need to have your cholesterol levels checked more often if:  Your lipid  or cholesterol levels are high.  You are older than 31 years of age.  You are at high risk for heart disease.  CANCER SCREENING   Lung Cancer  Lung cancer screening is recommended for adults 55-80 years old who are at high risk for lung cancer because of a history of smoking.  A yearly low-dose CT scan of the lungs is recommended for people who:  Currently smoke.  Have quit within the past 15 years.  Have at least a 30-pack-year history of smoking. A pack year is smoking an average of one pack of cigarettes a day for 1 year.  Yearly screening should continue until it has been 15 years since you quit.  Yearly screening should stop if you develop a health problem that would prevent you from having lung cancer treatment.  Breast Cancer  Practice breast self-awareness. This means understanding how your breasts normally appear and feel.  It also means doing regular breast self-exams. Let your health care provider know about any changes, no matter how small.  If you are in your 20s or 30s, you should have a clinical breast exam (CBE) by a health care provider every 1-3 years as part of a regular health exam.  If you are 40 or older, have a CBE every year. Also consider having a breast X-ray (mammogram) every year.  If you have a family history of breast cancer, talk to your health care provider about genetic screening.  If you   are at high risk for breast cancer, talk to your health care provider about having an MRI and a mammogram every year.  Breast cancer gene (BRCA) assessment is recommended for women who have family members with BRCA-related cancers. BRCA-related cancers include:  Breast.  Ovarian.  Tubal.  Peritoneal cancers.  Results of the assessment will determine the need for genetic counseling and BRCA1 and BRCA2 testing. Cervical Cancer Your health care provider may recommend that you be screened regularly for cancer of the pelvic organs (ovaries, uterus, and  vagina). This screening involves a pelvic examination, including checking for microscopic changes to the surface of your cervix (Pap test). You may be encouraged to have this screening done every 3 years, beginning at age 21.  For women ages 30-65, health care providers may recommend pelvic exams and Pap testing every 3 years, or they may recommend the Pap and pelvic exam, combined with testing for human papilloma virus (HPV), every 5 years. Some types of HPV increase your risk of cervical cancer. Testing for HPV may also be done on women of any age with unclear Pap test results.  Other health care providers may not recommend any screening for nonpregnant women who are considered low risk for pelvic cancer and who do not have symptoms. Ask your health care provider if a screening pelvic exam is right for you.  If you have had past treatment for cervical cancer or a condition that could lead to cancer, you need Pap tests and screening for cancer for at least 20 years after your treatment. If Pap tests have been discontinued, your risk factors (such as having a new sexual partner) need to be reassessed to determine if screening should resume. Some women have medical problems that increase the chance of getting cervical cancer. In these cases, your health care provider may recommend more frequent screening and Pap tests. Colorectal Cancer  This type of cancer can be detected and often prevented.  Routine colorectal cancer screening usually begins at 31 years of age and continues through 31 years of age.  Your health care provider may recommend screening at an earlier age if you have risk factors for colon cancer.  Your health care provider may also recommend using home test kits to check for hidden blood in the stool.  A small camera at the end of a tube can be used to examine your colon directly (sigmoidoscopy or colonoscopy). This is done to check for the earliest forms of colorectal  cancer.  Routine screening usually begins at age 50.  Direct examination of the colon should be repeated every 5-10 years through 31 years of age. However, you may need to be screened more often if early forms of precancerous polyps or small growths are found. Skin Cancer  Check your skin from head to toe regularly.  Tell your health care provider about any new moles or changes in moles, especially if there is a change in a mole's shape or color.  Also tell your health care provider if you have a mole that is larger than the size of a pencil eraser.  Always use sunscreen. Apply sunscreen liberally and repeatedly throughout the day.  Protect yourself by wearing long sleeves, pants, a wide-brimmed hat, and sunglasses whenever you are outside. HEART DISEASE, DIABETES, AND HIGH BLOOD PRESSURE   High blood pressure causes heart disease and increases the risk of stroke. High blood pressure is more likely to develop in:  People who have blood pressure in the high end   of the normal range (130-139/85-89 mm Hg).  People who are overweight or obese.  People who are African American.  If you are 38-23 years of age, have your blood pressure checked every 3-5 years. If you are 61 years of age or older, have your blood pressure checked every year. You should have your blood pressure measured twice--once when you are at a hospital or clinic, and once when you are not at a hospital or clinic. Record the average of the two measurements. To check your blood pressure when you are not at a hospital or clinic, you can use:  An automated blood pressure machine at a pharmacy.  A home blood pressure monitor.  If you are between 45 years and 39 years old, ask your health care provider if you should take aspirin to prevent strokes.  Have regular diabetes screenings. This involves taking a blood sample to check your fasting blood sugar level.  If you are at a normal weight and have a low risk for diabetes,  have this test once every three years after 31 years of age.  If you are overweight and have a high risk for diabetes, consider being tested at a younger age or more often. PREVENTING INFECTION  Hepatitis B  If you have a higher risk for hepatitis B, you should be screened for this virus. You are considered at high risk for hepatitis B if:  You were born in a country where hepatitis B is common. Ask your health care provider which countries are considered high risk.  Your parents were born in a high-risk country, and you have not been immunized against hepatitis B (hepatitis B vaccine).  You have HIV or AIDS.  You use needles to inject street drugs.  You live with someone who has hepatitis B.  You have had sex with someone who has hepatitis B.  You get hemodialysis treatment.  You take certain medicines for conditions, including cancer, organ transplantation, and autoimmune conditions. Hepatitis C  Blood testing is recommended for:  Everyone born from [birth year] through 1966.  Anyone with known risk factors for hepatitis C. Sexually transmitted infections (STIs)  You should be screened for sexually transmitted infections (STIs) including gonorrhea and chlamydia if:  You are sexually active and are younger than 31 years of age.  You are older than 31 years of age and your health care provider tells you that you are at risk for this type of infection.  Your sexual activity has changed since you were last screened and you are at an increased risk for chlamydia or gonorrhea. Ask your health care provider if you are at risk.  If you do not have HIV, but are at risk, it may be recommended that you take a prescription medicine daily to prevent HIV infection. This is called pre-exposure prophylaxis (PrEP). You are considered at risk if:  You are sexually active and do not regularly use condoms or know the HIV status of your partner(s).  You take drugs by injection.  You are sexually  active with a partner who has HIV. Talk with your health care provider about whether you are at high risk of being infected with HIV. If you choose to begin PrEP, you should first be tested for HIV. You should then be tested every 3 months for as long as you are taking PrEP.  PREGNANCY   If you are premenopausal and you may become pregnant, ask your health care provider about preconception counseling.  If you may  become pregnant, take 400 to 800 micrograms (mcg) of folic acid every day.  If you want to prevent pregnancy, talk to your health care provider about birth control (contraception). OSTEOPOROSIS AND MENOPAUSE   Osteoporosis is a disease in which the bones lose minerals and strength with aging. This can result in serious bone fractures. Your risk for osteoporosis can be identified using a bone density scan.  If you are 61 years of age or older, or if you are at risk for osteoporosis and fractures, ask your health care provider if you should be screened.  Ask your health care provider whether you should take a calcium or vitamin D supplement to lower your risk for osteoporosis.  Menopause may have certain physical symptoms and risks.  Hormone replacement therapy may reduce some of these symptoms and risks. Talk to your health care provider about whether hormone replacement therapy is right for you.  HOME CARE INSTRUCTIONS   Schedule regular health, dental, and eye exams.  Stay current with your immunizations.   Do not use any tobacco products including cigarettes, chewing tobacco, or electronic cigarettes.  If you are pregnant, do not drink alcohol.  If you are breastfeeding, limit how much and how often you drink alcohol.  Limit alcohol intake to no more than 1 drink per day for nonpregnant women. One drink equals 12 ounces of beer, 5 ounces of wine, or 1 ounces of hard liquor.  Do not use street drugs.  Do not share needles.  Ask your health care provider for help if  you need support or information about quitting drugs.  Tell your health care provider if you often feel depressed.  Tell your health care provider if you have ever been abused or do not feel safe at home.   This information is not intended to replace advice given to you by your health care provider. Make sure you discuss any questions you have with your health care provider.   Document Released: 11/27/2010 Document Revised: 06/04/2014 Document Reviewed: 04/15/2013 Elsevier Interactive Patient Education [employer].

## 2015-11-23 NOTE — Progress Notes (Signed)
Patient ID: Vickie Mejia, female   DOB: 01/30/1986, 30 y.o.   MRN: [id number] 30 y.o. G0P0000 Single African American female here for annual exam.    Concerned about weight gain.  Gained 5 pounds in the last year, but thought it was actually more.   Unremarkable pelvic ultrasound in June 2016.  Done for irregular cycles and heavy bleeding.  Now on progesterone only pills.  Having pills every 1.5 - 2 months.  First day is heavy and then the rest is light.  Feels like they are not as heavy as they were.   Migraines are without aura.  Headaches can be menstrual or related to weather changes.  Takes OTC meds and this controls the pain.   PCP:  None   Patient's last menstrual period was 11/10/2015.     Period Duration (Days):  (5-7) Period Pattern: (!) Irregular (still a little irregular but not near as heavy) Menstrual Flow: Moderate Menstrual Control: Maxi pad Dysmenorrhea: (!) Mild Dysmenorrhea Symptoms: Cramping, Headache (headaches/low back discomfort prior to cycle)     Sexually active: No.   Never sexually active.  The current method of family planning is abstinence/OCPs--Heather.    Exercising: Yes.   walks 2 miles/day 5 days/week  Smoker:  no  Health Maintenance: Pap:  10-20-14 Neg History of abnormal Pap:  no MMG:  n/a Colonoscopy:  n/a BMD:   n/a  Result  n/a TDaP:  05-28-06 Gardasil:   no HIV: Hep C: Screening Labs:  Hb today: 13.7, Urine today: unable to void   reports that she has never smoked. She has never used smokeless tobacco. She reports that she drinks alcohol. She reports that she does not use illicit drugs.  Past Medical History  Diagnosis Date  . Migraines     without aura  . Asthma     Past Surgical History  Procedure Laterality Date  . Wisdom tooth extraction      Current Outpatient Prescriptions  Medication Sig Dispense Refill  . HEATHER 0.35 MG tablet TAKE 1 TABLET DAILY 84 tablet 0   No current facility-administered medications for  this visit.    Family History  Problem Relation Age of Onset  . Pancreatic cancer Mother   . Diabetes Mother   . Hypertension Paternal Grandmother   . Stroke Paternal Grandfather     ROS:  Pertinent items are noted in HPI.  Otherwise, a comprehensive ROS was negative.  Exam:   BP 122/76 mmHg  Pulse 80  Resp 18  Ht 6' 1.5" (1.867 m)  Wt 415 lb 12.8 oz (188.606 kg)  BMI 54.11 kg/m2  LMP 11/10/2015    General appearance: alert, cooperative and appears stated age Head: Normocephalic, without obvious abnormality, atraumatic Neck: no adenopathy, supple, symmetrical, trachea midline and thyroid normal to inspection and palpation Lungs: clear to auscultation bilaterally Breasts: normal appearance, no masses or tenderness, Inspection negative, No nipple retraction or dimpling, No nipple discharge or bleeding, No axillary or supraclavicular adenopathy Heart: regular rate and rhythm Abdomen: obese, incisions:  No.    , soft, non-tender; no masses, no organomegaly Extremities: extremities normal, atraumatic, no cyanosis or edema Skin: Skin color, texture, turgor normal. No rashes or lesions Lymph nodes: Cervical, supraclavicular, and axillary nodes normal. No abnormal inguinal nodes palpated Neurologic: Grossly normal  Pelvic: External genitalia:  no lesions              Urethra:  normal appearing urethra with no masses, tenderness or lesions  Bartholins and Skenes: normal                 Vagina: normal appearing vagina with normal color and discharge, no lesions              Cervix: no lesions              Pap taken: No. Bimanual Exam:  Uterus:  normal size, contour, position, consistency, mobility, non-tender.  Bimanual exam [facility] by [facility].               Adnexa: normal adnexa and no mass, fullness, tenderness                 Chaperone was present for exam.  Assessment:   Well woman visit with normal exam. Morbid obesity.  Cycles controlled with Micronor.   Menses  every 1.5 - 2 months.  Migraines without aura.   Plan: Yearly mammogram recommended after age 31.  Recommended self breast exam.  Pap and HR HPV as above. Discussed Calcium, Vitamin D, regular exercise program including cardiovascular and weight bearing exercise. Labs performed.  Yes.  .   See orders. Prescription medication(s) given.  Yes.  .  See orders.  Micronor for 12 months.  Patient understands the importance of calling for irregular cycles - skipping more than one month or bleeding more than one time per month.  I am recommending establishing care with a PCP.  I would really like for her to have a medical physician on her team and work in weight loss.  She will research options and do this by the end of the year.  Follow up annually and prn.       After visit summary provided.

## 2015-11-24 ENCOUNTER — Encounter: Payer: Self-pay | Admitting: Obstetrics and Gynecology

## 2015-11-24 LAB — HEMOGLOBIN A1C
Hgb A1c MFr Bld: 6 % — ABNORMAL HIGH (ref <5.7)
MEAN PLASMA GLUCOSE: 126 mg/dL

## 2016-11-21 ENCOUNTER — Other Ambulatory Visit: Payer: Self-pay | Admitting: Obstetrics and Gynecology

## 2016-11-21 NOTE — Telephone Encounter (Signed)
Medication refill request: OCP Last AEX:  11/23/15 BS Next AEX: 12/05/16 Last MMG (if hormonal medication request): n/a Refill authorized: 11/23/15 #[id number] w/3 refills; today please advise

## 2016-12-05 ENCOUNTER — Telehealth: Payer: Self-pay | Admitting: *Deleted

## 2016-12-05 ENCOUNTER — Encounter: Payer: Self-pay | Admitting: Obstetrics and Gynecology

## 2016-12-05 ENCOUNTER — Ambulatory Visit (INDEPENDENT_AMBULATORY_CARE_PROVIDER_SITE_OTHER): Payer: 59 | Admitting: Obstetrics and Gynecology

## 2016-12-05 VITALS — BP 138/80 | HR 84 | Resp 16 | Ht 74.0 in | Wt >= 6400 oz

## 2016-12-05 DIAGNOSIS — B372 Candidiasis of skin and nail: Secondary | ICD-10-CM | POA: Diagnosis not present

## 2016-12-05 DIAGNOSIS — R7309 Other abnormal glucose: Secondary | ICD-10-CM

## 2016-12-05 DIAGNOSIS — Z01419 Encounter for gynecological examination (general) (routine) without abnormal findings: Secondary | ICD-10-CM

## 2016-12-05 DIAGNOSIS — Z23 Encounter for immunization: Secondary | ICD-10-CM | POA: Diagnosis not present

## 2016-12-05 MED ORDER — NORETHINDRONE 0.35 MG PO TABS: 1.0000 | ORAL_TABLET | Freq: Every day | ORAL | 3 refills | Status: DC

## 2016-12-05 MED ORDER — NYSTATIN 100000 UNIT/GM EX POWD: Freq: Three times a day (TID) | CUTANEOUS | 2 refills | Status: DC

## 2016-12-05 NOTE — Progress Notes (Signed)
31 y.o. G0P0000 Single African American female here for annual exam.    On Camilla.  Missed one or two pills over the last 3 - 4 months.  Has menses about every 50 days.  Range is every 40 - 60 days.  The bleeding is heavy at middle with pad change every 2 - 3 hours.  Total length is 6 - 7 days. No sig cramping.   Pelvic US in 2016, unremarkable.   Not sexually active.  No partners since last visit.   Has elevated hgb A1C and liver function studies. Saw a PCP in [facility].  This was not a good fit.  Does not have a PCP currently.   Some rash and itching at her elastic band of her abdomen.   PCP:  No PCP  Patient's last menstrual period was 11/13/2016.        Sexually active: No.  The current method of family planning is OCP (estrogen/progesterone).    Exercising: Yes.    walking Smoker:  no  Health Maintenance: Pap:  10-20-14 Neg History of abnormal Pap:  no MMG:  n/a Colonoscopy:  n/a BMD:   n/a  Result  n/a TDaP:  05/28/06 Gardasil:   no Screening Labs: discuss today   reports that she has never smoked. She has never used smokeless tobacco. She reports that she drinks alcohol. She reports that she does not use drugs.  Past Medical History:  Diagnosis Date  . Asthma   . Elevated hemoglobin A1c June 2017  . Elevated liver function tests June 2017  . Migraines    without aura    Past Surgical History:  Procedure Laterality Date  . WISDOM TOOTH EXTRACTION      Current Outpatient Prescriptions  Medication Sig Dispense Refill  . norethindrone (MICRONOR,CAMILA,ERRIN) 0.35 MG tablet TAKE 1 TABLET DAILY 28 tablet 0   No current facility-administered medications for this visit.     Family History  Problem Relation Age of Onset  . Pancreatic cancer Mother   . Diabetes Mother   . Hypertension Paternal Grandmother   . Stroke Paternal Grandfather     ROS:  Pertinent items are noted in HPI.  Otherwise, a comprehensive ROS was negative.  Exam:   BP 138/80  (BP Location: Right Arm, Patient Position: Sitting, Cuff Size: Normal)   Pulse 84   Resp 16   Ht 6\' 2"  (1.88 m)   Wt (!) 412 lb (186.9 kg)   LMP 11/13/2016   BMI 52.90 kg/m     General appearance: alert, cooperative and appears stated age Head: Normocephalic, without obvious abnormality, atraumatic Neck: no adenopathy, supple, symmetrical, trachea midline and thyroid normal to inspection and palpation Lungs: clear to auscultation bilaterally Breasts: normal appearance, no masses or tenderness, No nipple retraction or dimpling, No nipple discharge or bleeding, No axillary or supraclavicular adenopathy Heart: regular rate and rhythm Abdomen: soft, non-tender; no masses, no organomegaly Extremities: extremities normal, atraumatic, no cyanosis or edema Skin: Skin color, texture, turgor normal. Rash along waist band, thighs, and under breasts.  Has acanthosis nigricans also.  Lymph nodes: Cervical, supraclavicular, and axillary nodes normal. No abnormal inguinal nodes palpated Neurologic: Grossly normal  Pelvic: External genitalia:  no lesions              Urethra:  normal appearing urethra with no masses, tenderness or lesions              Bartholins and Skenes: normal  Vagina: normal appearing vagina with normal color and discharge, no lesions              Cervix: no lesions              Pap taken: No. Bimanual Exam:  Uterus:  normal size, contour, position, consistency, mobility, non-tender.  Exam limited by [facility].              Adnexa: no mass, fullness, tenderness         Chaperone was present for exam.  Assessment:   Well woman visit with normal exam. On Micronor for control of cycles and protection of endometrium.  Candida of flexural skin.  Elevated hgb A1C, obesity, and elevated BP.   Plan: Mammogram screening discussed. Recommended self breast awareness. Pap and HR HPV as above. Guidelines for Calcium, Vitamin D, regular exercise program including  cardiovascular and weight bearing exercise. Micronor 3 packs and 3 refills.  Nystatin powder.  Instructed in use.  TDap. Will refer to PCP.   Follow up annually and prn.   After visit summary provided.

## 2016-12-05 NOTE — Patient Instructions (Signed)

## 2016-12-05 NOTE — Telephone Encounter (Signed)
Call transferred from front office staff. Patient states [facility], [facility] Apologies to patient. Rx to [facility] discontinued, new order placed for Nystatin powder to [facility]. Patient verbalizes understanding.   [facility] spoke with Olegario MessierKathy. Rx for nystatin powder cancelled.    Routing to provider for final review. Patient is agreeable to disposition. Will close encounter.

## 2016-12-07 ENCOUNTER — Telehealth: Payer: Self-pay | Admitting: Obstetrics and Gynecology

## 2016-12-07 NOTE — Telephone Encounter (Signed)
Left voicemail regarding referral appointment. The information is listed below. Should the patient need to cancel or reschedule this appointment, please advise them to call the office they've been referred to in order to reschedule.  [facility] [place] [address] [place]  Phone: [phone]  Dr. Selena BattenKim 12-10-16 @ 8:30am. Please arrive 30 minutes early and bring your insurance card, photo id and list of medications.

## 2016-12-10 ENCOUNTER — Ambulatory Visit: Payer: Commercial Indemnity | Admitting: Family Medicine

## 2017-01-02 ENCOUNTER — Ambulatory Visit: Payer: Commercial Indemnity | Admitting: Family Medicine

## 2017-01-30 ENCOUNTER — Encounter: Payer: Self-pay | Admitting: Family Medicine

## 2017-01-30 ENCOUNTER — Ambulatory Visit (INDEPENDENT_AMBULATORY_CARE_PROVIDER_SITE_OTHER): Payer: Managed Care, Other (non HMO) | Admitting: Family Medicine

## 2017-01-30 VITALS — BP 124/80 | HR 91 | Resp 16 | Ht 74.0 in | Wt >= 6400 oz

## 2017-01-30 DIAGNOSIS — M25571 Pain in right ankle and joints of right foot: Secondary | ICD-10-CM

## 2017-01-30 DIAGNOSIS — IMO0001 Reserved for inherently not codable concepts without codable children: Secondary | ICD-10-CM

## 2017-01-30 DIAGNOSIS — M722 Plantar fascial fibromatosis: Secondary | ICD-10-CM

## 2017-01-30 DIAGNOSIS — M25579 Pain in unspecified ankle and joints of unspecified foot: Secondary | ICD-10-CM

## 2017-01-30 DIAGNOSIS — M25572 Pain in left ankle and joints of left foot: Secondary | ICD-10-CM

## 2017-01-30 DIAGNOSIS — R740 Nonspecific elevation of levels of transaminase and lactic acid dehydrogenase [LDH]: Secondary | ICD-10-CM

## 2017-01-30 DIAGNOSIS — R7303 Prediabetes: Secondary | ICD-10-CM

## 2017-01-30 DIAGNOSIS — G8929 Other chronic pain: Secondary | ICD-10-CM | POA: Diagnosis not present

## 2017-01-30 DIAGNOSIS — E669 Obesity, unspecified: Secondary | ICD-10-CM

## 2017-01-30 DIAGNOSIS — R7401 Elevation of levels of liver transaminase levels: Secondary | ICD-10-CM

## 2017-01-30 DIAGNOSIS — Z6841 Body Mass Index (BMI) 40.0 and over, adult: Secondary | ICD-10-CM | POA: Diagnosis not present

## 2017-01-30 LAB — COMPREHENSIVE METABOLIC PANEL
ALBUMIN: 4.3 g/dL (ref 3.5–5.2)
ALT: 28 U/L (ref 0–35)
AST: 31 U/L (ref 0–37)
Alkaline Phosphatase: 58 U/L (ref 39–117)
BUN: 7 mg/dL (ref 6–23)
CHLORIDE: 102 meq/L (ref 96–112)
CO2: 26 mEq/L (ref 19–32)
CREATININE: 0.77 mg/dL (ref 0.40–1.20)
Calcium: 9.8 mg/dL (ref 8.4–10.5)
GFR: 111.78 mL/min (ref >60.00)
Glucose, Bld: 84 mg/dL (ref 70–99)
Potassium: 4.4 mEq/L (ref 3.5–5.1)
SODIUM: 137 meq/L (ref 135–145)
Total Bilirubin: 0.5 mg/dL (ref 0.2–1.2)
Total Protein: 7.3 g/dL (ref 6.0–8.3)

## 2017-01-30 LAB — HEMOGLOBIN A1C: HEMOGLOBIN A1C: 5.8 % (ref 4.6–6.5)

## 2017-01-30 NOTE — Progress Notes (Signed)
HPI:   Vickie Mejia is a 31 y.o. female, who is here today to establish care.  Former PCP: Dr Cleta Alberts [facility] Last preventive routine visit: Gyn evaluation 11/2016.  Chronic medical problems: Pre diabetes,obesity, and ankle pain among some.  Prediabetes: Lab Results  Component Value Date   HGBA1C 6.0 (H) 11/23/2015   Elevated transaminases: She denies high alcohol intake. Lab Results  Component Value Date   ALT 33 (H) 11/23/2015   AST 32 (H) 11/23/2015   ALKPHOS 53 11/23/2015   BILITOT 0.4 11/23/2015    Denies abdominal pain, nausea, vomiting, changes in bowel habits,or color changes of urine/stool. No recent travel and denies high alcohol intake.  Concerns today: "Certain pains" , which she attributes to her wt and getting older.  Feet and intermittent ankles pain, R>L which she attributes to flat feet.   Right heel pain for the past month or so. Pain is worse in the morning, alleviated by walking. She has not noted erythema or edema. Denies recent trauma. She denies numbness or tingling.   She has had ankle pain for a while, in the past she has received right ankle shots. She has seen podiatrist in the past year 2-3 times.   She exercises regularly, walks daily to work. She has tried to eat healthier in the past month. She has lost about 10 Lb since she started.   Review of Systems  Constitutional: Negative for activity change, appetite change, fatigue, fever and unexpected weight change.  HENT: Negative for mouth sores, nosebleeds and trouble swallowing.   Eyes: Negative for redness and visual disturbance.  Respiratory: Negative for cough, shortness of breath and wheezing.   Cardiovascular: Negative for chest pain, palpitations and leg swelling.  Gastrointestinal: Negative for abdominal pain, nausea and vomiting.       Negative for changes in bowel habits.  Endocrine: Negative for cold intolerance, heat intolerance,  polydipsia, polyphagia and polyuria.  Genitourinary: Negative for decreased urine volume and hematuria.  Musculoskeletal: Positive for arthralgias. Negative for back pain, gait problem and myalgias.  Skin: Negative for rash and wound.  Allergic/Immunologic: Positive for environmental allergies.  Neurological: Negative for syncope, weakness, numbness and headaches.  Psychiatric/Behavioral: Negative for confusion. The patient is not nervous/anxious.       Current Outpatient Prescriptions on File Prior to Visit  Medication Sig Dispense Refill  . norethindrone (MICRONOR,CAMILA,ERRIN) 0.35 MG tablet Take 1 tablet (0.35 mg total) by mouth daily. 3 Package 3   No current facility-administered medications on file prior to visit.      Past Medical History:  Diagnosis Date  . Asthma   . Elevated hemoglobin A1c June 2017  . Elevated liver function tests June 2017  . Migraines    without aura   Allergies  Allergen Reactions  . Sulfa Antibiotics Itching and Other (See Comments)    Tingly feeling in eye, redness   . Coconut Oil Itching and Swelling    Swelling of the throat --- very mild reaction -- just by mouth     Family History  Problem Relation Age of Onset  . Pancreatic cancer Mother   . Diabetes Mother   . Cancer Mother        pancreatic  . Hypertension Paternal Grandmother   . COPD Paternal Grandmother   . Stroke Paternal Grandfather   . Epilepsy Brother     Social History   Social History  . Marital status: Single    Spouse  name: N/A  . Number of children: N/A  . Years of education: N/A   Social History Main Topics  . Smoking status: Never Smoker  . Smokeless tobacco: Never Used  . Alcohol use 0.0 - 0.6 oz/week  . Drug use: No  . Sexual activity: No     Comment: Heather   Other Topics Concern  . None   Social History Narrative  . None    Vitals:   01/30/17 0924  BP: 124/80  Pulse: 91  Resp: 16  SpO2: 96%    Body mass index is 52.29  kg/m.   Physical Exam  Nursing note and vitals reviewed. Constitutional: She is oriented to person, place, and time. She appears well-developed. No distress.  HENT:  Head: Normocephalic and atraumatic.  Mouth/Throat: Oropharynx is clear and moist and mucous membranes are normal.  Eyes: Pupils are equal, round, and reactive to light. Conjunctivae and EOM are normal.  Neck: No tracheal deviation present. No thyroid mass and no thyromegaly present.  Cardiovascular: Normal rate and regular rhythm.   No murmur heard. Pulses:      Dorsalis pedis pulses are 2+ on the right side, and 2+ on the left side.  Respiratory: Effort normal and breath sounds normal. No respiratory distress.  GI: Soft. She exhibits no mass. There is no hepatomegaly. There is no tenderness.  Musculoskeletal: She exhibits no edema.       Right ankle: Tenderness. Lateral malleolus and medial malleolus tenderness found. Achilles tendon exhibits normal Thompson's test results.       Feet:  Tenderness upon palpation of right heel. No skin changes,erythema,or edema.  Right ankle pain with palpation of peri malleolar areas. No limitation of ROM.  Lymphadenopathy:    She has no cervical adenopathy.  Neurological: She is alert and oriented to person, place, and time. She has normal strength. Coordination normal.  Skin: Skin is warm. No rash noted. No erythema.  Psychiatric: She has a normal mood and affect.  Well groomed, good eye contact.     ASSESSMENT AND PLAN:   Vickie Mejia was seen today for establish care.  Diagnoses and all orders for this visit:  Lab Results  Component Value Date   ALT 28 01/30/2017   AST 31 01/30/2017   ALKPHOS 58 01/30/2017   BILITOT 0.5 01/30/2017   Lab Results  Component Value Date   CREATININE 0.77 01/30/2017   BUN 7 01/30/2017   NA 137 01/30/2017   K 4.4 01/30/2017   CL 102 01/30/2017   CO2 26 01/30/2017   Lab Results  Component Value Date   HGBA1C 5.8 01/30/2017     Chronic pain of both ankles  She has seen podiatrist for similar problem. Wt gain can certainly aggravate problem well as flat foot. Shoe inserts may help. F/U with podiatrists recommended.  Plantar fasciitis, right  Educated about Dx and treatment options. Comfortable shoe, night splint,and stretching/massage of plantar area with bottle or ball. She may need to follow with podiatrists if not better with recommendations given.  Elevated transaminase level  Mild. Possible causes dicussed, ? fatty liver. Wt loss may help. Further recommendations will be given according to lab results.  -     Comprehensive metabolic panel  Prediabetes  Primary prevention through a healthy life style to continue. Further recommendations will be given according to HgA1C.  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 We discussed benefits of wt loss as well as adverse effects of obesity. Consistency with healthy diet and physical activity recommended.     Gerlean Cid G. [place], MD  [facility]. [place] office.

## 2017-01-30 NOTE — Patient Instructions (Signed)
A few things to remember from today's visit:   Plantar fasciitis, right  Elevated transaminase level - Plan: Comprehensive metabolic panel  Prediabetes - Plan: Hemoglobin A1c   Plantar Fasciitis Plantar fasciitis is a painful foot condition that affects the heel. It occurs when the band of tissue that connects the toes to the heel bone (plantar fascia) becomes irritated. This can happen after exercising too much or doing other repetitive activities (overuse injury). The pain from plantar fasciitis can range from mild irritation to severe pain that makes it difficult for you to walk or move. The pain is usually worse in the morning or after you have been sitting or lying down for a while. What are the causes? This condition may be caused by:  Standing for long periods of time.  Wearing shoes that do not fit.  Doing high-impact activities, including running, aerobics, and ballet.  Being overweight.  Having an abnormal way of walking (gait).  Having tight calf muscles.  Having high arches in your feet.  Starting a new athletic activity.  What are the signs or symptoms? The main symptom of this condition is heel pain. Other symptoms include:  Pain that gets worse after activity or exercise.  Pain that is worse in the morning or after resting.  Pain that goes away after you walk for a few minutes.  How is this diagnosed? This condition may be diagnosed based on your signs and symptoms. Your health care provider will also do a physical exam to check for:  A tender area on the bottom of your foot.  A high arch in your foot.  Pain when you move your foot.  Difficulty moving your foot.  You may also need to have imaging studies to confirm the diagnosis. These can include:  X-rays.  Ultrasound.  MRI.  How is this treated? Treatment for plantar fasciitis depends on the severity of the condition. Your treatment may include:  Rest, ice, and over-the-counter pain  medicines to manage your pain.  Exercises to stretch your calves and your plantar fascia.  A splint that holds your foot in a stretched, upward position while you sleep (night splint).  Physical therapy to relieve symptoms and prevent problems in the future.  Cortisone injections to relieve severe pain.  Extracorporeal shock wave therapy (ESWT) to stimulate damaged plantar fascia with electrical impulses. It is often used as a last resort before surgery.  Surgery, if other treatments have not worked after 12 months.  Follow these instructions at home:  Take medicines only as directed by your health care provider.  Avoid activities that cause pain.  Roll the bottom of your foot over a bag of ice or a bottle of cold water. Do this for 20 minutes, 3-4 times a day.  Perform simple stretches as directed by your health care provider.  Try wearing athletic shoes with air-sole or gel-sole cushions or soft shoe inserts.  Wear a night splint while sleeping, if directed by your health care provider.  Keep all follow-up appointments with your health care provider. How is this prevented?  Do not perform exercises or activities that cause heel pain.  Consider finding low-impact activities if you continue to have problems.  Lose weight if you need to. The best way to prevent plantar fasciitis is to avoid the activities that aggravate your plantar fascia. Contact a health care provider if:  Your symptoms do not go away after treatment with home care measures.  Your pain gets worse.  Your  pain affects your ability to move or do your daily activities. This information is not intended to replace advice given to you by your health care provider. Make sure you discuss any questions you have with your health care provider. Document Released: 02/06/2001 Document Revised: 10/17/2015 Document Reviewed: 03/24/2014 Elsevier Interactive Patient Education  2018 [employer].  Please be sure  medication list is accurate. If a new problem present, please set up appointment sooner than planned today.

## 2017-01-31 ENCOUNTER — Encounter: Payer: Self-pay | Admitting: Family Medicine

## 2017-02-14 ENCOUNTER — Encounter: Payer: Self-pay | Admitting: Family Medicine

## 2017-10-30 ENCOUNTER — Other Ambulatory Visit: Payer: Self-pay | Admitting: Obstetrics and Gynecology

## 2017-10-30 NOTE — Telephone Encounter (Signed)
Medication refill request: Vickie Mejia 0.35mg  Last AEX:  12-05-16 Next AEX: 01-29-18 Last MMG (if hormonal medication request): none Refill authorized: rx last given for [id number] at [facility]. Please approve if appropriate

## 2017-12-19 ENCOUNTER — Encounter: Payer: Self-pay | Admitting: Obstetrics and Gynecology

## 2017-12-19 ENCOUNTER — Ambulatory Visit (INDEPENDENT_AMBULATORY_CARE_PROVIDER_SITE_OTHER): Payer: Managed Care, Other (non HMO) | Admitting: Obstetrics and Gynecology

## 2017-12-19 ENCOUNTER — Other Ambulatory Visit (HOSPITAL_COMMUNITY)
Admission: RE | Admit: 2017-12-19 | Discharge: 2017-12-19 | Disposition: A | Discharge status: Home / Self Care | Payer: Managed Care, Other (non HMO) | Source: Ambulatory Visit | Attending: Obstetrics and Gynecology | Admitting: Obstetrics and Gynecology

## 2017-12-19 ENCOUNTER — Other Ambulatory Visit: Payer: Self-pay

## 2017-12-19 VITALS — BP 122/72 | HR 80 | Resp 16 | Ht 74.0 in | Wt >= 6400 oz

## 2017-12-19 DIAGNOSIS — Z01419 Encounter for gynecological examination (general) (routine) without abnormal findings: Secondary | ICD-10-CM | POA: Diagnosis not present

## 2017-12-19 MED ORDER — NORETHINDRONE 0.35 MG PO TABS: 1.0000 | ORAL_TABLET | Freq: Every day | ORAL | 3 refills | Status: DC

## 2017-12-19 NOTE — Patient Instructions (Signed)

## 2017-12-19 NOTE — Progress Notes (Signed)
32 y.o. G0P0000 Single African American female here for annual exam.    Having lighter menses on POPs. Now having mucousy bloody discharge instead of a period.  This occurs for 4 -5 days and occurs once per month.   Does have hormonal headaches.  These are not consistently occurring every month.   Also has HA with barometric pressure changes.   Something on her foot.  No pain.   Walking to work.   Labs with PCP.   PCP:  Betty [place].   Patient's last menstrual period was 12/13/2017.           Sexually active: No.  The current method of family planning is Progesterone OCP.  Exercising: Yes.    walking and yoga Smoker:  no  Health Maintenance: Pap:  10/20/14 Negative History of abnormal Pap:  no TDaP:  12/05/16 Gardasil:   no Screening Labs: discuss today   reports that she has never smoked. She has never used smokeless tobacco. She reports that she drinks alcohol. She reports that she does not use drugs.  Past Medical History:  Diagnosis Date  . Asthma   . Elevated hemoglobin A1c June 2017  . Elevated liver function tests June 2017  . Migraines    without aura    Past Surgical History:  Procedure Laterality Date  . WISDOM TOOTH EXTRACTION      Current Outpatient Medications  Medication Sig Dispense Refill  . HEATHER 0.35 MG tablet TAKE 1 TABLET DAILY 84 tablet 0   No current facility-administered medications for this visit.     Family History  Problem Relation Age of Onset  . Pancreatic cancer Mother   . Diabetes Mother   . Cancer Mother        pancreatic  . Hypertension Paternal Grandmother   . COPD Paternal Grandmother   . Stroke Paternal Grandfather   . Epilepsy Brother     Review of Systems  Constitutional: Negative.   HENT: Negative.   Eyes: Negative.   Respiratory: Negative.   Cardiovascular: Negative.   Gastrointestinal: Negative.   Endocrine: Negative.   Genitourinary: Negative.   Musculoskeletal: Negative.   Skin: Negative.    Allergic/Immunologic: Negative.   Neurological: Negative.   Hematological: Negative.   Psychiatric/Behavioral: Negative.     Exam:   BP 122/72 (BP Location: Right Arm, Patient Position: Sitting, Cuff Size: Large)   Pulse 80   Resp 16   Ht 6\' 2"  (1.88 m)   Wt (!) 421 lb (191 kg)   LMP 12/13/2017   BMI 54.05 kg/m     General appearance: alert, cooperative and appears stated age Head: Normocephalic, without obvious abnormality, atraumatic Neck: no adenopathy, supple, symmetrical, trachea midline and thyroid normal to inspection and palpation Lungs: clear to auscultation bilaterally Breasts: normal appearance, no masses or tenderness, No nipple retraction or dimpling, No nipple discharge or bleeding, No axillary or supraclavicular adenopathy Heart: regular rate and rhythm Abdomen: soft, non-tender; no masses, no organomegaly Extremities: extremities normal, atraumatic, no cyanosis or edema.  Callous of left plantar foot surface.  Skin: Skin color, texture, turgor normal. No rashes or lesions Lymph nodes: Cervical, supraclavicular, and axillary nodes normal. No abnormal inguinal nodes palpated Neurologic: Grossly normal  Pelvic: External genitalia:  no lesions              Urethra:  normal appearing urethra with no masses, tenderness or lesions              Bartholins and Skenes: normal  Vagina: normal appearing vagina with normal color and discharge, no lesions              Cervix: no lesions              Pap taken: Yes.   Bimanual Exam:  Uterus:  normal size, contour, position, consistency, mobility, non-tender              Adnexa: no mass, fullness, tenderness              [facility].  Chaperone was present for exam.  Assessment:   Well woman visit with normal exam. Migraine without aura. Occasional menstrual HA.  Hx elevated hemoglobin A1C and elevated LFTs which resolved. Foot callus.   Plan: Mammogram screening. Recommended self breast  awareness. Pap and HR HPV as above. Guidelines for Calcium, Vitamin D, regular exercise program including cardiovascular and weight bearing exercise. Refill of Micronor x 1 year.  Labs with PCP.  I recommend she see a podiatrist or manicurist for foot cleansing and care.  Follow up annually and prn.   After visit summary provided.

## 2017-12-20 LAB — CYTOLOGY - PAP
Diagnosis: NEGATIVE
HPV: NOT DETECTED

## 2018-01-29 ENCOUNTER — Ambulatory Visit (INDEPENDENT_AMBULATORY_CARE_PROVIDER_SITE_OTHER): Payer: Managed Care, Other (non HMO) | Admitting: Family Medicine

## 2018-01-29 ENCOUNTER — Encounter: Payer: Self-pay | Admitting: Family Medicine

## 2018-01-29 VITALS — BP 120/76 | HR 85 | Temp 98.0°F | Resp 12 | Ht 74.0 in | Wt >= 6400 oz

## 2018-01-29 DIAGNOSIS — L84 Corns and callosities: Secondary | ICD-10-CM | POA: Diagnosis not present

## 2018-01-29 DIAGNOSIS — R945 Abnormal results of liver function studies: Secondary | ICD-10-CM | POA: Diagnosis not present

## 2018-01-29 DIAGNOSIS — S91209A Unspecified open wound of unspecified toe(s) with damage to nail, initial encounter: Secondary | ICD-10-CM | POA: Diagnosis not present

## 2018-01-29 DIAGNOSIS — Z13220 Encounter for screening for lipoid disorders: Secondary | ICD-10-CM | POA: Diagnosis not present

## 2018-01-29 DIAGNOSIS — Z0000 Encounter for general adult medical examination without abnormal findings: Secondary | ICD-10-CM

## 2018-01-29 DIAGNOSIS — Z131 Encounter for screening for diabetes mellitus: Secondary | ICD-10-CM

## 2018-01-29 DIAGNOSIS — R7989 Other specified abnormal findings of blood chemistry: Secondary | ICD-10-CM

## 2018-01-29 LAB — LIPID PANEL
CHOLESTEROL: 191 mg/dL (ref 0–200)
HDL: 44.7 mg/dL (ref >39.00)
LDL Cholesterol: 132 mg/dL — ABNORMAL HIGH (ref 0–99)
NonHDL: 145.84
Total CHOL/HDL Ratio: 4
Triglycerides: 68 mg/dL (ref 0.0–149.0)
VLDL: 13.6 mg/dL (ref 0.0–40.0)

## 2018-01-29 LAB — HEMOGLOBIN A1C: Hgb A1c MFr Bld: 6 % (ref 4.6–6.5)

## 2018-01-29 LAB — HEPATIC FUNCTION PANEL
ALT: 25 U/L (ref 0–35)
AST: 27 U/L (ref 0–37)
Albumin: 4.1 g/dL (ref 3.5–5.2)
Alkaline Phosphatase: 51 U/L (ref 39–117)
BILIRUBIN DIRECT: 0.1 mg/dL (ref 0.0–0.3)
BILIRUBIN TOTAL: 0.5 mg/dL (ref 0.2–1.2)
Total Protein: 7.3 g/dL (ref 6.0–8.3)

## 2018-01-29 LAB — BASIC METABOLIC PANEL
BUN: 9 mg/dL (ref 6–23)
CHLORIDE: 104 meq/L (ref 96–112)
CO2: 28 mEq/L (ref 19–32)
CREATININE: 0.82 mg/dL (ref 0.40–1.20)
Calcium: 9.3 mg/dL (ref 8.4–10.5)
GFR: 103.31 mL/min (ref >60.00)
Glucose, Bld: 84 mg/dL (ref 70–99)
Potassium: 4.3 mEq/L (ref 3.5–5.1)
SODIUM: 139 meq/L (ref 135–145)

## 2018-01-29 NOTE — Assessment & Plan Note (Signed)
Gained about 13 Lb sine 01/2017. We discussed benefits of wt loss as well as adverse effects of obesity. Consistency with healthy diet and physical activity recommended.

## 2018-01-29 NOTE — Progress Notes (Signed)
HPI:   Vickie Mejia is a 32 y.o. female, who is here today for her routine physical.  Last CPE: 1-2 years.  Regular exercise 3 or more time per week: Walking and yoga 3-5 times per week and yoga 3 days per week. Following a healthy diet: Not consistently. She is drinking more water but in general she has not changed her dietary habits.  She lives alone.  Chronic medical problems: Obesity,prediabetes,chronic ankle pain.  Lab Results  Component Value Date   HGBA1C 5.8 01/30/2017    Pap smear 11/2017. She follows with gyn, Dr Ricki Miller, last visit in 11/2016.   Immunization History  Administered Date(s) Administered  . Influenza-Unspecified 03/29/2015  . Tdap 12/05/2016  . Tetanus 05/28/2006    She has some concerns today.  A couple weeks ago she noted dark area under the toenail of right great toe, ? bruise.  She does not recall having any trauma, she is not having any pain or limitation of movement. Problem seems to be unchanged since first noted. She has not tried OTC remedies.  Was decrease ago she noted a "cut" aspect left foot, mildly tender.  She has been treated area with pumice stone and soaking foot a few times per week.  She does not recall specific trauma.  History of elevated transaminases. No history of high alcohol use.  Denies abdominal pain, nausea, vomiting, changes in bowel habits,or jaundice.  Lab Results  Component Value Date   ALT 28 01/30/2017   AST 31 01/30/2017   ALKPHOS 58 01/30/2017   BILITOT 0.5 01/30/2017     Review of Systems  Constitutional: Negative for appetite change, fatigue and fever.  HENT: Negative for dental problem, hearing loss, mouth sores, sore throat and trouble swallowing.   Eyes: Negative for redness and visual disturbance.  Respiratory: Negative for cough, shortness of breath and wheezing.   Cardiovascular: Negative for chest pain and leg swelling.  Gastrointestinal: Negative for abdominal pain,  nausea and vomiting.       No changes in bowel habits.  Endocrine: Negative for cold intolerance, heat intolerance, polydipsia, polyphagia and polyuria.  Genitourinary: Negative for decreased urine volume, hematuria, vaginal bleeding and vaginal discharge.  Musculoskeletal: Positive for arthralgias. Negative for gait problem.  Skin: Negative for color change and rash.  Allergic/Immunologic: Positive for environmental allergies.  Neurological: Negative for syncope, weakness and headaches.  Hematological: Negative for adenopathy. Does not bruise/bleed easily.  Psychiatric/Behavioral: Negative for confusion and sleep disturbance. The patient is not nervous/anxious.   All other systems reviewed and are negative.     Current Outpatient Medications on File Prior to Visit  Medication Sig Dispense Refill  . norethindrone (HEATHER) 0.35 MG tablet Take 1 tablet (0.35 mg total) by mouth daily. 84 tablet 3   No current facility-administered medications on file prior to visit.      Past Medical History:  Diagnosis Date  . Asthma   . Elevated hemoglobin A1c June 2017  . Elevated liver function tests June 2017  . Migraines    without aura    Past Surgical History:  Procedure Laterality Date  . WISDOM TOOTH EXTRACTION      Allergies  Allergen Reactions  . Sulfa Antibiotics Itching and Other (See Comments)    Tingly feeling in eye, redness   . Coconut Oil Itching and Swelling    Swelling of the throat --- very mild reaction -- just by mouth     Family History  Problem Relation  Age of Onset  . Pancreatic cancer Mother   . Diabetes Mother   . Cancer Mother        pancreatic  . Hypertension Paternal Grandmother   . COPD Paternal Grandmother   . Stroke Paternal Grandfather   . Epilepsy Brother     Social History   Socioeconomic History  . Marital status: Single    Spouse name: Not on file  . Number of children: Not on file  . Years of education: Not on file  . Highest  education level: Not on file  Occupational History  . Not on file  Social Needs  . Financial resource strain: Not on file  . Food insecurity:    Worry: Not on file    Inability: Not on file  . Transportation needs:    Medical: Not on file    Non-medical: Not on file  Tobacco Use  . Smoking status: Never Smoker  . Smokeless tobacco: Never Used  Substance and Sexual Activity  . Alcohol use: Yes    Alcohol/week: 0.0 - 1.0 standard drinks  . Drug use: No  . Sexual activity: Never    Partners: Male    Birth control/protection: Abstinence, Pill    Comment: Heather  Lifestyle  . Physical activity:    Days per week: Not on file    Minutes per session: Not on file  . Stress: Not on file  Relationships  . Social connections:    Talks on phone: Not on file    Gets together: Not on file    Attends religious service: Not on file    Active member of club or organization: Not on file    Attends meetings of clubs or organizations: Not on file    Relationship status: Not on file  Other Topics Concern  . Not on file  Social History Narrative  . Not on file     Vitals:   01/29/18 0835  BP: 120/76  Pulse: 85  Resp: 12  Temp: 98 F (36.7 C)  SpO2: 97%   Body mass index is 53.99 kg/m.   Wt Readings from Last 3 Encounters:  01/29/18 (!) 420 lb 8 oz (190.7 kg)  12/19/17 (!) 421 lb (191 kg)  01/30/17 (!) 407 lb 4 oz (184.7 kg)     Physical Exam  Nursing note and vitals reviewed. Constitutional: She is oriented to person, place, and time. She appears well-developed. No distress.  HENT:  Head: Normocephalic and atraumatic.  Right Ear: Hearing, tympanic membrane, external ear and ear canal normal.  Left Ear: Hearing, tympanic membrane, external ear and ear canal normal.  Mouth/Throat: Uvula is midline, oropharynx is clear and moist and mucous membranes are normal.  Eyes: Pupils are equal, round, and reactive to light. Conjunctivae and EOM are normal.  Neck: No tracheal  deviation present. No thyromegaly present.  Cardiovascular: Normal rate and regular rhythm.  No murmur heard. Pulses:      Dorsalis pedis pulses are 2+ on the right side, and 2+ on the left side.  Respiratory: Effort normal and breath sounds normal. No respiratory distress.  GI: Soft. She exhibits no mass. There is no hepatomegaly. There is no tenderness.  Genitourinary:  Genitourinary Comments: Deferred to gyn.  Musculoskeletal: She exhibits edema (Trace pitting LE edema,bilateral). She exhibits no tenderness.  No major deformity or signs of synovitis appreciated.  Lymphadenopathy:    She has no cervical adenopathy.       Right: No supraclavicular adenopathy present.  Left: No supraclavicular adenopathy present.  Neurological: She is alert and oriented to person, place, and time. She has normal strength. No cranial nerve deficit. Coordination and gait normal.  Reflex Scores:      Bicep reflexes are 2+ on the right side and 2+ on the left side.      Patellar reflexes are 2+ on the right side and 2+ on the left side. Skin: Skin is warm. No rash noted. No erythema.  Left plantar ,5th MTP area with callus with superficial flap. See picture. Right toenail of great toe with detached toenail distally.New toenail is growing,healthy appearance.   Psychiatric: She has a normal mood and affect. Cognition and memory are normal.  Well groomed, good eye contact.        ASSESSMENT AND PLAN:  Ms. Shira Bobst was here today annual physical examination.   Orders Placed This Encounter  Procedures  . Basic metabolic panel  . Hemoglobin A1c  . Lipid panel  . Hepatic function panel   Lab Results  Component Value Date   CREATININE 0.82 01/29/2018   BUN 9 01/29/2018   NA 139 01/29/2018   K 4.3 01/29/2018   CL 104 01/29/2018   CO2 28 01/29/2018   Lab Results  Component Value Date   CHOL 191 01/29/2018   HDL 44.70 01/29/2018   LDLCALC 132 (H) 01/29/2018   TRIG 68.0  01/29/2018   CHOLHDL 4 01/29/2018   Lab Results  Component Value Date   ALT 25 01/29/2018   AST 27 01/29/2018   ALKPHOS 51 01/29/2018   BILITOT 0.5 01/29/2018   Lab Results  Component Value Date   HGBA1C 6.0 01/29/2018     Routine general medical examination at a health care facility  We discussed the importance of regular physical activity and healthy diet for prevention of chronic illness and/or complications. Preventive guidelines reviewed. Vaccination up-to-date. She is planning on getting influenza vaccine next week through work. Continue female preventive care through her gynecologist.  Next CPE in a year.  Diabetes mellitus screening -     Basic metabolic panel -     Hemoglobin A1c  Screening for lipid disorders -     Lipid panel  Plantar callus  After verbal consent and using pointed scissors callus flap was trimmed.  She tolerated procedure well. Recommend soaking foot a few times per week and warm water and use pumice stone to smooth area. Follow-up as needed.  Avulsion of toenail of right foot  Most likely related to microtrauma of toenail against shoewear. Recommend treatment toenail periodically.  Abnormal LFTs  Last LFTs in 01/2017 were in normal range. I strongly recommend weight loss. Further recommendation will be given according to hepatic panel results.  -     Hepatic function panel  Obesity, morbid, BMI 50 or higher (HCC) Gained about 13 Lb sine 01/2017. We discussed benefits of wt loss as well as adverse effects of obesity. Consistency with healthy diet and physical activity recommended.    Return in 1 year (on 01/30/2019) for cpe.     Lavetta Geier G. [place], MD  [facility]. [place] office.

## 2018-01-29 NOTE — Patient Instructions (Addendum)
A few things to remember from today's visit:   Routine general medical examination at a health care facility  Diabetes mellitus screening - Plan: Basic metabolic panel, Hemoglobin A1c  Screening for lipid disorders - Plan: Lipid panel  Plantar callus  Avulsion of toenail of right foot Today you have you routine preventive visit.  At least 150 minutes of moderate exercise per week, daily brisk walking for 15-30 min is a good exercise option. Healthy diet low in saturated (animal) fats and sweets and consisting of fresh fruits and vegetables, lean meats such as fish and white chicken and whole grains.  These are some of recommendations for screening depending of age and risk factors:   - Vaccines:  Tdap vaccine every 10 years.  Shingles vaccine recommended at age 62, could be given after 33 years of age but not sure about insurance coverage.   Pneumonia vaccines:  Prevnar 13 at 65 and Pneumovax at 66. Sometimes Pneumovax is giving earlier if history of smoking, lung disease,diabetes,kidney disease among some.    Screening for diabetes at age 63 and every 3 years.  Cervical cancer prevention:  Pap smear starts at 32 years of age and continues periodically until 33 years old in low risk women. Pap smear every 3 years between 11 and 48 years old. Pap smear every 3-5 years between women 30 and older if pap smear negative and HPV screening negative.   -Breast cancer: Mammogram: There is disagreement between experts about when to start screening in low risk asymptomatic female but recent recommendations are to start screening at 50 and not later than 33 years old , every 1-2 years and after 33 yo q 2 years. Screening is recommended until 33 years old but some women can continue screening depending of healthy issues.   Colon cancer screening: starts at 33 years old until 33 years old.  Cholesterol disorder screening at age 81 and every 3 years.  Also recommended:  1. Dental  visit- Brush and floss your teeth twice daily; visit your dentist twice a year. 2. Eye doctor- Get an eye exam at least every 2 years. 3. Helmet use- Always wear a helmet when riding a bicycle, motorcycle, rollerblading or skateboarding. 4. Safe sex- If you may be exposed to sexually transmitted infections, use a condom. 5. Seat belts- Seat belts can save your live; always wear one. 6. Smoke/Carbon Monoxide detectors- These detectors need to be installed on the appropriate level of your home. Replace batteries at least once a year. 7. Skin cancer- When out in the sun please cover up and use sunscreen 15 SPF or higher. 8. Violence- If anyone is threatening or hurting you, please tell your healthcare provider.  9. Drink alcohol in moderation- Limit alcohol intake to one drink or less per day. Never drink and drive.   Please be sure medication list is accurate. If a new problem present, please set up appointment sooner than planned today.

## 2018-02-02 ENCOUNTER — Encounter: Payer: Self-pay | Admitting: Family Medicine

## 2018-04-04 ENCOUNTER — Telehealth: Payer: Self-pay | Admitting: Obstetrics and Gynecology

## 2018-04-04 NOTE — Telephone Encounter (Signed)
Left message on voicemail to call and reschedule cancelled appointment. °

## 2018-05-07 ENCOUNTER — Ambulatory Visit (INDEPENDENT_AMBULATORY_CARE_PROVIDER_SITE_OTHER): Payer: Managed Care, Other (non HMO) | Admitting: Family Medicine

## 2018-05-07 ENCOUNTER — Encounter: Payer: Self-pay | Admitting: Family Medicine

## 2018-05-07 VITALS — BP 122/76 | HR 91 | Temp 98.3°F | Resp 12 | Ht 74.0 in | Wt >= 6400 oz

## 2018-05-07 DIAGNOSIS — S5001XD Contusion of right elbow, subsequent encounter: Secondary | ICD-10-CM | POA: Diagnosis not present

## 2018-05-07 DIAGNOSIS — W108XXD Fall (on) (from) other stairs and steps, subsequent encounter: Secondary | ICD-10-CM

## 2018-05-07 DIAGNOSIS — M542 Cervicalgia: Secondary | ICD-10-CM | POA: Diagnosis not present
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
## 2018-05-07 NOTE — Patient Instructions (Signed)
A few things to remember from today's visit:   Hemarthrosis of right elbow  Fall down stairs, subsequent encounter  Neck pain   Tennis Elbow Tennis elbow is puffiness (inflammation) of the outer tendons of your forearm close to your elbow. Your tendons attach your muscles to your bones. Tennis elbow can happen in any sport or job in which you use your elbow too much. It is caused by doing the same motion over and over. Tennis elbow can cause:  Pain and tenderness in your forearm and the outer part of your elbow.  A burning feeling. This runs from your elbow through your arm.  Weak grip in your hands.  Follow these instructions at home: Activity  Rest your elbow and wrist as told by your doctor. Try to avoid any activities that caused the problem until your doctor says that you can do them again.  If a physical therapist teaches you exercises, do all of them as told.  If you lift an object, lift it with your palm facing up. This is easier on your elbow. Lifestyle  If your tennis elbow is caused by sports, check your equipment and make sure that: ? You are using it correctly. ? It fits you well.  If your tennis elbow is caused by work, take breaks often, if you are able. Talk with your manager about doing your work in a way that is safe for you. ? If your tennis elbow is caused by computer use, talk with your manager about any changes that can be made to your work setup. General instructions  If told, apply ice to the painful area: ? Put ice in a plastic bag. ? Place a towel between your skin and the bag. ? Leave the ice on for 20 minutes, 2-3 times per day.  Take medicines only as told by your doctor.  If you were given a brace, wear it as told by your doctor.  Keep all follow-up visits as told by your doctor. This is important. Contact a doctor if:  Your pain does not get better with treatment.  Your pain gets worse.  You have weakness in your forearm, hand, or  fingers.  You cannot feel your forearm, hand, or fingers. This information is not intended to replace advice given to you by your health care provider. Make sure you discuss any questions you have with your health care provider. Document Released: 11/01/2009 Document Revised: 01/12/2016 Document Reviewed: 05/10/2014 Elsevier Interactive Patient Education  [employer]2018 [employer]  Please be sure medication list is accurate. If a new problem present, please set up appointment sooner than planned today.

## 2018-08-17 ENCOUNTER — Telehealth: Payer: Managed Care, Other (non HMO) | Admitting: Family

## 2018-08-17 ENCOUNTER — Other Ambulatory Visit: Payer: Self-pay | Admitting: Family

## 2018-08-17 DIAGNOSIS — R059 Cough, unspecified: Secondary | ICD-10-CM

## 2018-08-17 DIAGNOSIS — R05 Cough: Secondary | ICD-10-CM | POA: Diagnosis not present

## 2018-08-17 MED ORDER — BENZONATATE 100 MG PO CAPS: 100.0000 mg | ORAL_CAPSULE | Freq: Three times a day (TID) | ORAL | 0 refills | Status: DC | PRN

## 2018-08-17 MED ORDER — ALBUTEROL SULFATE HFA 108 (90 BASE) MCG/ACT IN AERS: 2.0000 | INHALATION_SPRAY | Freq: Four times a day (QID) | RESPIRATORY_TRACT | 0 refills | Status: AC | PRN

## 2018-08-17 MED ORDER — FLUTICASONE PROPIONATE 50 MCG/ACT NA SUSP: 2.0000 | Freq: Every day | NASAL | 6 refills | Status: DC

## 2018-08-17 NOTE — Progress Notes (Signed)
We are sorry you are not feeling well.  Here is how we plan to help!  Based on what you have shared with me, it looks like you may have a viral upper respiratory infection.  Upper respiratory infections are caused by a large number of viruses; however, rhinovirus is the most common cause.   Symptoms vary from person to person, with common symptoms including sore throat, cough, and fatigue or lack of energy.  A low-grade fever of up to 100.4 may present, but is often uncommon.  Symptoms vary however, and are closely related to a person's age or underlying illnesses.  The most common symptoms associated with an upper respiratory infection are nasal discharge or congestion, cough, sneezing, headache and pressure in the ears and face.  These symptoms usually persist for about 3 to 10 days, but can last up to 2 weeks.  It is important to know that upper respiratory infections do not cause serious illness or complications in most cases.    Upper respiratory infections can be transmitted from person to person, with the most common method of transmission being a person's hands.  The virus is able to live on the skin and can infect other persons for up to 2 hours after direct contact.  Also, these can be transmitted when someone coughs or sneezes; thus, it is important to cover the mouth to reduce this risk.  To keep the spread of the illness at bay, good hand hygiene is very important.  This is an infection that is most likely caused by a virus. There are no specific treatments other than to help you with the symptoms until the infection runs its course.  We are sorry you are not feeling well.  Here is how we plan to help!   For nasal congestion, you may use an oral decongestants such as Mucinex D or if you have glaucoma or high blood pressure use plain Mucinex.  Saline nasal spray or nasal drops can help and can safely be used as often as needed for congestion.  For your congestion, I have prescribed Fluticasone  nasal spray one spray in each nostril twice a day  If you do not have a history of heart disease, hypertension, diabetes or thyroid disease, prostate/bladder issues or glaucoma, you may also use Sudafed to treat nasal congestion.  It is highly recommended that you consult with a pharmacist or your primary care physician to ensure this medication is safe for you to take.     If you have a cough, you may use cough suppressants such as Delsym and Robitussin.  If you have glaucoma or high blood pressure, you can also use Coricidin HBP.   For cough I have prescribed for you A prescription cough medication called Tessalon Perles 100 mg. You may take 1-2 capsules every 8 hours as needed for cough. I have also sent in an albuterol inhaler to your pharmacy.   If you have a sore or scratchy throat, use a saltwater gargle-  to  teaspoon of salt dissolved in a 4-ounce to 8-ounce glass of warm water.  Gargle the solution for approximately 15-30 seconds and then spit.  It is important not to swallow the solution.  You can also use throat lozenges/cough drops and Chloraseptic spray to help with throat pain or discomfort.  Warm or cold liquids can also be helpful in relieving throat pain.  For headache, pain or general discomfort, you can use Ibuprofen or Tylenol as directed.   Some authorities  believe that zinc sprays or the use of Echinacea may shorten the course of your symptoms.   HOME CARE . Only take medications as instructed by your medical team. . Be sure to drink plenty of fluids. Water is fine as well as fruit juices, sodas and electrolyte beverages. You may want to stay away from caffeine or alcohol. If you are nauseated, try taking small sips of liquids. How do you know if you are getting enough fluid? Your urine should be a pale yellow or almost colorless. . Get rest. . Taking a steamy shower or using a humidifier may help nasal congestion and ease sore throat pain. You can place a towel over your  head and breathe in the steam from hot water coming from a faucet. . Using a saline nasal spray works much the same way. . Cough drops, hard candies and sore throat lozenges may ease your cough. . Avoid close contacts especially the very young and the elderly . Cover your mouth if you cough or sneeze . Always remember to wash your hands.   GET HELP RIGHT AWAY IF: . You develop worsening fever. . If your symptoms do not improve within 10 days . You develop yellow or Calica discharge from your nose over 3 days. . You have coughing fits . You develop a severe head ache or visual changes. . You develop shortness of breath, difficulty breathing or start having chest pain . Your symptoms persist after you have completed your treatment plan  MAKE SURE YOU   Understand these instructions.  Will watch your condition.  Will get help right away if you are not doing well or get worse.  Your e-visit answers were reviewed by a board certified advanced clinical practitioner to complete your personal care plan. Depending upon the condition, your plan could have included both over the counter or prescription medications. Please review your pharmacy choice. If there is a problem, you may call our nursing hot line at and have the prescription routed to another pharmacy. Your safety is important to [place]. If you have drug allergies check your prescription carefully.   You can use MyChart to ask questions about today's visit, request a non-urgent call back, or ask for a work or school excuse for 24 hours related to this e-Visit. If it has been greater than 24 hours you will need to follow up with your provider, or enter a new e-Visit to address those concerns. You will get an e-mail in the next two days asking about your experience.  I hope that your e-visit has been valuable and will speed your recovery. Thank you for using e-visits.

## 2018-08-17 NOTE — Progress Notes (Signed)
Approximately 5 minutes was spent documenting and reviewing patient's chart.   

## 2018-12-02 ENCOUNTER — Other Ambulatory Visit: Payer: Self-pay | Admitting: Obstetrics and Gynecology

## 2018-12-02 NOTE — Telephone Encounter (Signed)
Medication refill request: Vickie Mejia Last AEX:  12/19/17 Next AEX: 12/23/18 Last MMG (if hormonal medication request): NA Refill authorized: #[id number] with 0 rf

## 2018-12-19 ENCOUNTER — Other Ambulatory Visit: Payer: Self-pay

## 2018-12-23 ENCOUNTER — Encounter: Payer: Self-pay | Admitting: Obstetrics and Gynecology

## 2018-12-23 ENCOUNTER — Other Ambulatory Visit: Payer: Self-pay

## 2018-12-23 ENCOUNTER — Ambulatory Visit: Payer: Managed Care, Other (non HMO) | Admitting: Obstetrics and Gynecology

## 2018-12-23 VITALS — BP 112/64 | HR 76 | Temp 97.8°F | Ht 73.75 in | Wt >= 6400 oz

## 2018-12-23 DIAGNOSIS — B372 Candidiasis of skin and nail: Secondary | ICD-10-CM | POA: Diagnosis not present

## 2018-12-23 DIAGNOSIS — Z01419 Encounter for gynecological examination (general) (routine) without abnormal findings: Secondary | ICD-10-CM | POA: Diagnosis not present

## 2018-12-23 MED ORDER — NYSTATIN 100000 UNIT/GM EX POWD: Freq: Three times a day (TID) | CUTANEOUS | 2 refills | Status: DC

## 2018-12-23 MED ORDER — NORETHINDRONE 0.35 MG PO TABS: 1.0000 | ORAL_TABLET | Freq: Every day | ORAL | 3 refills | Status: DC

## 2018-12-23 NOTE — Progress Notes (Signed)
33 y.o. G0P0000 Single African American female here for annual exam.  Patient says that she scratched herself lower abdomen and has a rash under her pannus.  States menses every 1.5 months.  Occurs about every 35 - 45 days.  Some breaking out with Micronor.  Periods are 5 days and not so heavy.   URI at the end of March.  Using her inhaler as her asthma is causing some problems for her.  Heat bothers her.   Working from home.   Labs with PCP in September, 2020.   PCP:   Betty [place]   No LMP recorded. (Menstrual status: Oral contraceptives).           Sexually active: No.  The current method of family planning is Progesterone OCP.    Exercising: Yes.    Walking Smoker:  no  Health Maintenance: Pap:  12/19/17 Neg:Neg HR HPV History of abnormal Pap:  no TDaP:  2018 Gardasil:   no Screening Labs: none   reports that she has never smoked. She has never used smokeless tobacco. She reports current alcohol use. She reports that she does not use drugs.  Past Medical History:  Diagnosis Date  . Asthma   . Elevated hemoglobin A1c June 2017  . Elevated liver function tests June 2017  . Migraines    without aura    Past Surgical History:  Procedure Laterality Date  . WISDOM TOOTH EXTRACTION      Current Outpatient Medications  Medication Sig Dispense Refill  . albuterol (PROVENTIL HFA;VENTOLIN HFA) 108 (90 Base) MCG/ACT inhaler Inhale 2 puffs into the lungs every 6 (six) hours as needed for wheezing or shortness of breath. 1 Inhaler 0  . HEATHER 0.35 MG tablet TAKE 1 TABLET DAILY 28 tablet 0  . naproxen (NAPROSYN) 500 MG tablet TK 1 T PO  BID PRN. TK MEDICATION WITH FOOD  0   No current facility-administered medications for this visit.     Family History  Problem Relation Age of Onset  . Pancreatic cancer Mother   . Diabetes Mother   . Cancer Mother        pancreatic  . Hypertension Paternal Grandmother   . COPD Paternal Grandmother   . Stroke Paternal Grandfather    . Epilepsy Brother     Review of Systems  All other systems reviewed and are negative.   Exam:   BP 112/64   Pulse 76   Temp 97.8 F (36.6 C)   Ht 6' 1.75" (1.873 m)   Wt (!) 426 lb (193.2 kg)   SpO2 98%   BMI 55.07 kg/m     General appearance: alert, cooperative and appears stated age Head: normocephalic, without obvious abnormality, atraumatic Neck: no adenopathy, supple, symmetrical, trachea midline and thyroid normal to inspection and palpation Lungs: clear to auscultation bilaterally Breasts: normal appearance, no masses or tenderness, No nipple retraction or dimpling, No nipple discharge or bleeding, No axillary adenopathy Heart: regular rate and rhythm Abdomen: soft, non-tender; no masses, no organomegaly Extremities: extremities normal, atraumatic, no cyanosis or edema Skin: skin color, texture, turgor normal. Erythema under pannus.  Lymph nodes: cervical, supraclavicular, and axillary nodes normal. Neurologic: grossly normal  Pelvic: External genitalia:  no lesions              No abnormal inguinal nodes palpated.              Urethra:  normal appearing urethra with no masses, tenderness or lesions  Bartholins and Skenes: normal                 Vagina: normal appearing vagina with normal color and discharge, no lesions              Cervix: no lesions              Pap taken: No. Bimanual Exam:  Uterus:  normal size, contour, position, consistency, mobility, non-tender              Adnexa: no mass, fullness, tenderness            Chaperone was present for exam.  Assessment:   Well woman visit with normal exam. Migraine without aura. Occasional menstrual HA.  Hx elevated hemoglobin A1C and elevated LFTs which resolved. Candida of flexural folds. Asthma.   Plan: Mammogram screening discussed. Self breast awareness reviewed. Pap and HR HPV as above. Guidelines for Calcium, Vitamin D, regular exercise program including cardiovascular and weight  bearing exercise. Refill of Micronor.  Nystatin powder. She will reach out to her PCP for further assistance with her asthma.  She declines assistance with weight loss at this time.  Follow up annually and prn.   After visit summary provided.

## 2019-01-15 ENCOUNTER — Ambulatory Visit: Payer: Managed Care, Other (non HMO) | Admitting: Obstetrics and Gynecology

## 2019-02-03 ENCOUNTER — Ambulatory Visit (INDEPENDENT_AMBULATORY_CARE_PROVIDER_SITE_OTHER): Payer: Managed Care, Other (non HMO) | Admitting: Family Medicine

## 2019-02-03 ENCOUNTER — Other Ambulatory Visit: Payer: Self-pay

## 2019-02-03 ENCOUNTER — Encounter: Payer: Self-pay | Admitting: Family Medicine

## 2019-02-03 VITALS — BP 130/90 | HR 81 | Temp 98.7°F | Ht 74.75 in | Wt >= 6400 oz

## 2019-02-03 DIAGNOSIS — E785 Hyperlipidemia, unspecified: Secondary | ICD-10-CM | POA: Diagnosis not present

## 2019-02-03 DIAGNOSIS — Z131 Encounter for screening for diabetes mellitus: Secondary | ICD-10-CM

## 2019-02-03 DIAGNOSIS — Z23 Encounter for immunization: Secondary | ICD-10-CM

## 2019-02-03 DIAGNOSIS — M25521 Pain in right elbow: Secondary | ICD-10-CM | POA: Diagnosis not present

## 2019-02-03 DIAGNOSIS — Z0000 Encounter for general adult medical examination without abnormal findings: Secondary | ICD-10-CM | POA: Diagnosis not present

## 2019-02-03 DIAGNOSIS — R030 Elevated blood-pressure reading, without diagnosis of hypertension: Secondary | ICD-10-CM

## 2019-02-03 LAB — BASIC METABOLIC PANEL
BUN: 11 mg/dL (ref 6–23)
CO2: 28 mEq/L (ref 19–32)
Calcium: 9.4 mg/dL (ref 8.4–10.5)
Chloride: 103 mEq/L (ref 96–112)
Creatinine, Ser: 0.72 mg/dL (ref 0.40–1.20)
GFR: 112.25 mL/min (ref >60.00)
Glucose, Bld: 77 mg/dL (ref 70–99)
Potassium: 4.3 mEq/L (ref 3.5–5.1)
Sodium: 140 mEq/L (ref 135–145)

## 2019-02-03 LAB — LIPID PANEL
Cholesterol: 184 mg/dL (ref 0–200)
HDL: 43.6 mg/dL (ref >39.00)
LDL Cholesterol: 123 mg/dL — ABNORMAL HIGH (ref 0–99)
NonHDL: 140.86
Total CHOL/HDL Ratio: 4
Triglycerides: 91 mg/dL (ref 0.0–149.0)
VLDL: 18.2 mg/dL (ref 0.0–40.0)

## 2019-02-03 LAB — HEMOGLOBIN A1C: Hgb A1c MFr Bld: 6.4 % (ref 4.6–6.5)

## 2019-02-03 NOTE — Patient Instructions (Addendum)
Today you have you routine preventive visit.  At least 150 minutes of moderate exercise per week, daily brisk walking for 15-30 min is a good exercise option. Healthy diet low in saturated (animal) fats and sweets and consisting of fresh fruits and vegetables, lean meats such as fish and white chicken and whole grains.  These are some of recommendations for screening depending of age and risk factors:  A few things to remember from today's visit:   Routine general medical examination at a health care facility  Hyperlipidemia, unspecified hyperlipidemia type - Plan: Lipid panel  Diabetes mellitus screening - Plan: Hemoglobin D3O, Basic metabolic panel  Right elbow pain  Elevated blood pressure reading I recommend applying over-the-counter IcyHot with lidocaine on right elbow.  If problem is persistent we can consider physical therapy or orthopedist evaluation.   Your blood pressure was mildly elevated today, recommend monitoring blood pressure at home. Goal for blood pressure less or equal 130/80.   - Vaccines:  Tdap vaccine every 10 years.  Shingles vaccine recommended at age 44, could be given after 34 years of age but not sure about insurance coverage.   Pneumonia vaccines:  Prevnar 13 at 65 and Pneumovax at 48. Sometimes Pneumovax is giving earlier if history of smoking, lung disease,diabetes,kidney disease among some.    Screening for diabetes at age 64 and every 3 years.  Cervical cancer prevention:  Pap smear starts at 34 years of age and continues periodically until 34 years old in low risk women. Pap smear every 3 years between 45 and 74 years old. Pap smear every 3-5 years between women 37 and older if pap smear negative and HPV screening negative.   -Breast cancer: Mammogram: There is disagreement between experts about when to start screening in low risk asymptomatic female but recent recommendations are to start screening at 22 and not later than 34 years old ,  every 1-2 years and after 34 yo q 2 years. Screening is recommended until 34 years old but some women can continue screening depending of healthy issues.   Colon cancer screening: starts at 34 years old until 34 years old.  Also recommended:  1. Dental visit- Brush and floss your teeth twice daily; visit your dentist twice a year. 2. Eye doctor- Get an eye exam at least every 2 years. 3. Helmet use- Always wear a helmet when riding a bicycle, motorcycle, rollerblading or skateboarding. 4. Safe sex- If you may be exposed to sexually transmitted infections, use a condom. 5. Seat belts- Seat belts can save your live; always wear one. 6. Smoke/Carbon Monoxide detectors- These detectors need to be installed on the appropriate level of your home. Replace batteries at least once a year. 7. Skin cancer- When out in the sun please cover up and use sunscreen 15 SPF or higher. 8. Violence- If anyone is threatening or hurting you, please tell your healthcare provider.  9. Drink alcohol in moderation- Limit alcohol intake to one drink or less per day. Never drink and drive.

## 2019-02-03 NOTE — Progress Notes (Signed)
HPI:   [name] is a 33 y.o. female, who is here today for her routine physical.  Last CPE: 01/29/09 Regular exercise 3 or more time per week: She is "trying"but since she is working from home she has not been as active as she was,she has gained wt. Following a healthful diet: Snacking more often, she has not been consistent with following a healthful diet. She lives alone.  Chronic medical problems: Obesity,prediabetes,and chronic ankle pain. She follows with her gyn annually, Dr Judeth Horn. Pap smear 12/19/2017.  Immunization History  Administered Date(s) Administered  . Influenza,inj,Quad PF,6+ Mos 02/03/2019  . Influenza-Unspecified 03/29/2015  . Tdap 12/05/2016  . Tetanus 05/28/2006   Lab Results  Component Value Date   CHOL 191 01/29/2018   HDL 44.70 01/29/2018   LDLCALC 132 (H) 01/29/2018   TRIG 68.0 01/29/2018   CHOLHDL 4 01/29/2018    Lab Results  Component Value Date   HGBA1C 6.0 01/29/2018    Concerns today:  Right elbow pain , which has been intermittent since fall and elbow injury. Fall 04/26/18, hit elbow,had hematoma as a results but no findings that suggested fracture.  Pain resolved but for the apst few days has been aching. No limitation of ROM,edema,erythema,or numbness/tingling.  Review of Systems  Constitutional: Negative for appetite change, fatigue and fever.  HENT: Negative for dental problem, hearing loss, mouth sores, sore throat, trouble swallowing and voice change.   Eyes: Negative for redness and visual disturbance.  Respiratory: Negative for cough, shortness of breath and wheezing.   Cardiovascular: Negative for chest pain and leg swelling.  Gastrointestinal: Negative for abdominal pain, nausea and vomiting.       No changes in bowel habits.  Endocrine: Negative for cold intolerance, heat intolerance, polydipsia, polyphagia and polyuria.  Genitourinary: Negative for decreased urine volume, dysuria, hematuria,  vaginal bleeding and vaginal discharge.  Musculoskeletal: Negative for back pain, gait problem and myalgias. Skin: Negative for color change and rash.  Allergic/Immunologic: Negative for environmental allergies.  Neurological: Negative for syncope, weakness and headaches.  Hematological: Negative for adenopathy. Does not bruise/bleed easily.  Psychiatric/Behavioral: Negative for confusion and sleep disturbance. The patient is not nervous/anxious.   All other systems reviewed and are negative.  Current Outpatient Medications on File Prior to Visit  Medication Sig Dispense Refill  . albuterol (PROVENTIL HFA;VENTOLIN HFA) 108 (90 Base) MCG/ACT inhaler Inhale 2 puffs into the lungs every 6 (six) hours as needed for wheezing or shortness of breath. 1 Inhaler 0  . naproxen (NAPROSYN) 500 MG tablet TK 1 T PO  BID PRN. TK MEDICATION WITH FOOD  0  . norethindrone (HEATHER) 0.35 MG tablet Take 1 tablet (0.35 mg total) by mouth daily. 3 Package 3  . nystatin (MYCOSTATIN/NYSTOP) powder Apply topically 3 (three) times daily. Apply to affected area for up to 7 days 45 g 2   No current facility-administered medications on file prior to visit.      Past Medical History:  Diagnosis Date  . Asthma   . Elevated hemoglobin A1c June 2017  . Elevated liver function tests June 2017  . Migraines    without aura    Past Surgical History:  Procedure Laterality Date  . WISDOM TOOTH EXTRACTION      Allergies  Allergen Reactions  . Sulfa Antibiotics Itching and Other (See Comments)    Tingly feeling in eye, redness   . Coconut Oil Itching and Swelling    Swelling of the throat ---  very mild reaction -- just by mouth     Family History  Problem Relation Age of Onset  . Pancreatic cancer Mother   . Diabetes Mother   . Cancer Mother        pancreatic  . Hypertension Paternal Grandmother   . COPD Paternal Grandmother   . Stroke Paternal Grandfather   . Epilepsy Brother     Social History    Socioeconomic History  . Marital status: Single    Spouse name: Not on file  . Number of children: Not on file  . Years of education: Not on file  . Highest education level: Not on file  Occupational History  . Not on file  Social Needs  . Financial resource strain: Not on file  . Food insecurity    Worry: Not on file    Inability: Not on file  . Transportation needs    Medical: Not on file    Non-medical: Not on file  Tobacco Use  . Smoking status: Never Smoker  . Smokeless tobacco: Never Used  Substance and Sexual Activity  . Alcohol use: Yes    Alcohol/week: 0.0 - 1.0 standard drinks  . Drug use: No  . Sexual activity: Never    Partners: Male    Birth control/protection: Abstinence, Pill    Comment: Heather  Lifestyle  . Physical activity    Days per week: Not on file    Minutes per session: Not on file  . Stress: Not on file  Relationships  . Social Musician on phone: Not on file    Gets together: Not on file    Attends religious service: Not on file    Active member of club or organization: Not on file    Attends meetings of clubs or organizations: Not on file    Relationship status: Not on file  Other Topics Concern  . Not on file  Social History Narrative  . Not on file     Vitals:   02/03/19 0909  BP: 130/90  Pulse: 81  Temp: 98.7 F (37.1 C)  SpO2: 94%   Body mass index is 54.13 kg/m.   Wt Readings from Last 3 Encounters:  02/03/19 (!) 430 lb 3.2 oz (195.1 kg)  12/23/18 (!) 426 lb (193.2 kg)  05/07/18 (!) 430 lb (195 kg)    Physical Exam  Nursing note and vitals reviewed. Constitutional: She is oriented to person, place, and time. She appears well-developed. No distress.  HENT:  Head: Normocephalic and atraumatic.  Right Ear: Hearing, tympanic membrane, external ear and ear canal normal.  Left Ear: Hearing, tympanic membrane, external ear and ear canal normal.  Mouth/Throat: Uvula is midline, oropharynx is clear and moist  and mucous membranes are normal.  Eyes: Pupils are equal, round, and reactive to light. Conjunctivae and EOM are normal.  Neck: No tracheal deviation present. No thyromegaly present.  Cardiovascular: Normal rate and regular rhythm.  No murmur heard. Pulses:      Dorsalis pedis pulses are 2+ on the right side, and 2+ on the left side.  Respiratory: Effort normal and breath sounds normal. No respiratory distress.  GI: Soft. She exhibits no mass. There is no hepatomegaly. There is no tenderness.  Genitourinary:Comments: Deferred to gyn.  Musculoskeletal: She exhibits no edema.  No major deformity or signs of synovitis appreciated. Mild pain upon palpation of lateral, proximal aspect of right forearm.No edema or deformity,normal ROM. Lymphadenopathy:    She has no cervical  adenopathy.       Right: No supraclavicular adenopathy present.       Left: No supraclavicular adenopathy present.  Neurological: She is alert and oriented to person, place, and time. She has normal strength. No cranial nerve deficit. Coordination and gait normal.  Reflex Scores:      Bicep reflexes are 2+ on the right side and 2+ on the left side.      Patellar reflexes are 2+ on the right side and 2+ on the left side. Skin: Skin is warm. No rash noted. No erythema.  Psychiatric: She has a normal mood and affect. Cognitive function grossly intact. Well groomed, good eye contact.    ASSESSMENT AND PLAN:  Ms. Vickie Mejia was here today annual physical examination.   Orders Placed This Encounter  Procedures  . Flu Vaccine QUAD 6+ mos PF IM (Fluarix Quad PF)  . Hemoglobin A1c  . Lipid panel  . Basic metabolic panel   Lab Results  Component Value Date   HGBA1C 6.4 02/03/2019   Lab Results  Component Value Date   CHOL 184 02/03/2019   HDL 43.60 02/03/2019   LDLCALC 123 (H) 02/03/2019   TRIG 91.0 02/03/2019   CHOLHDL 4 02/03/2019   Lab Results  Component Value Date   CREATININE 0.72 02/03/2019    BUN 11 02/03/2019   NA 140 02/03/2019   K 4.3 02/03/2019   CL 103 02/03/2019   CO2 28 02/03/2019    Vickie Mejia was seen today for annual exam.  Diagnoses and all orders for this visit:  Routine general medical examination at a health care facility We discussed the importance of regular physical activity and healthy diet for prevention of chronic illness and/or complications. Preventive guidelines reviewed. Vaccination up to date. She will continue following with gynecologist for her female preventive care. Next CPE in a year.  Hyperlipidemia, unspecified hyperlipidemia type -     Lipid panel  Diabetes mellitus screening -     Hemoglobin A1c -     Basic metabolic panel  Right elbow pain I do not think imaging is needed today. OTC topical icy hot recommended for now. If persistent we could arrange PT and/or sport medicine evaluation.  Obesity, morbid, BMI 50 or higher (HCC) We discussed benefits of wt loss as well as adverse effects of obesity. Consistency with healthy diet and physical activity recommended.   Return in 1 year (on 02/03/2020) for cpe.     Kol Consuegra G. [place], MD  [facility]. [place] office.

## 2019-02-05 ENCOUNTER — Encounter: Payer: Self-pay | Admitting: Family Medicine

## 2019-10-05 ENCOUNTER — Other Ambulatory Visit: Payer: Self-pay

## 2019-10-06 ENCOUNTER — Encounter: Payer: Self-pay | Admitting: Family Medicine

## 2019-10-06 ENCOUNTER — Ambulatory Visit (INDEPENDENT_AMBULATORY_CARE_PROVIDER_SITE_OTHER): Payer: Managed Care, Other (non HMO) | Admitting: Family Medicine

## 2019-10-06 VITALS — BP 122/84 | HR 88 | Temp 97.6°F | Resp 16 | Ht 74.75 in | Wt >= 6400 oz

## 2019-10-06 DIAGNOSIS — H1010 Acute atopic conjunctivitis, unspecified eye: Secondary | ICD-10-CM | POA: Insufficient documentation

## 2019-10-06 DIAGNOSIS — H109 Unspecified conjunctivitis: Secondary | ICD-10-CM

## 2019-10-06 MED ORDER — CIPROFLOXACIN HCL 0.3 % OP SOLN: 1.0000 [drp] | OPHTHALMIC | 0 refills | Status: AC

## 2019-10-06 NOTE — Progress Notes (Signed)
ACUTE VISIT Chief Complaint  Patient presents with  . Eye Drainage    started 1 week ago, drainage in left eye   HPI: Vickie Mejia is a 34 y.o. female, who is here today with above complaint. Around 09/25/19 she started with watery eye and crusty areas around eye in the morning. A week ago she noted greenish discharge, worse in the morning but intermittently during the day. + Epiphora L>R. No hx of trauma. No sick contact. Problem is getting worse. She has not identified exacerbating or alleviating factors.  She has not tried OTC eye drops.  No fever,chills,eye pain,conjunctival erythema,pruritus,visual changes,or sore throat.  Hx of allergies. She takes Cetirizine 10 mg daily. She has not noted nasal congestion or rhinorrhea.  She was last seen on 02/03/19. She has gained some wt. She is working from home, more sedentary. She has not been consistent with following a healthful diet.  Review of Systems  Constitutional: Negative for activity change, appetite change and fatigue.  HENT: Negative for mouth sores and sinus pressure.   Respiratory: Negative for cough, shortness of breath and wheezing.   Gastrointestinal: Negative for nausea and vomiting.  Musculoskeletal: Negative for myalgias and neck pain.  Skin: Negative for rash.  Allergic/Immunologic: Positive for environmental allergies.  Neurological: Negative for weakness and headaches.  Hematological: Negative for adenopathy. Does not bruise/bleed easily.  Rest see pertinent positives and negatives per HPI.  Current Outpatient Medications on File Prior to Visit  Medication Sig Dispense Refill  . albuterol (PROVENTIL HFA;VENTOLIN HFA) 108 (90 Base) MCG/ACT inhaler Inhale 2 puffs into the lungs every 6 (six) hours as needed for wheezing or shortness of breath. 1 Inhaler 0  . Cetirizine HCl 10 MG CAPS     . naproxen (NAPROSYN) 500 MG tablet TK 1 T PO  BID PRN. TK MEDICATION WITH FOOD  0  .  norethindrone (HEATHER) 0.35 MG tablet Take 1 tablet (0.35 mg total) by mouth daily. 3 Package 3  . nystatin (MYCOSTATIN/NYSTOP) powder Apply topically 3 (three) times daily. Apply to affected area for up to 7 days 45 g 2   No current facility-administered medications on file prior to visit.   Past Medical History:  Diagnosis Date  . Asthma   . Elevated hemoglobin A1c June 2017  . Elevated liver function tests June 2017  . Migraines    without aura   Allergies  Allergen Reactions  . Sulfa Antibiotics Itching and Other (See Comments)    Tingly feeling in eye, redness   . Coconut Oil Itching and Swelling    Swelling of the throat --- very mild reaction -- just by mouth     Social History   Socioeconomic History  . Marital status: Single    Spouse name: Not on file  . Number of children: Not on file  . Years of education: Not on file  . Highest education level: Not on file  Occupational History  . Not on file  Tobacco Use  . Smoking status: Never Smoker  . Smokeless tobacco: Never Used  Substance and Sexual Activity  . Alcohol use: Yes    Alcohol/week: 0.0 - 1.0 standard drinks  . Drug use: No  . Sexual activity: Never    Partners: Male    Birth control/protection: Abstinence, Pill    Comment: Heather  Other Topics Concern  . Not on file  Social History Narrative  . Not on file   Social Determinants of Health   Financial  Resource Strain:   . Difficulty of Paying Living Expenses:   Food Insecurity:   . Worried About Charity fundraiser in the Last Year:   . Arboriculturist in the Last Year:   Transportation Needs:   . Film/video editor (Medical):   Marland Kitchen Lack of Transportation (Non-Medical):   Physical Activity:   . Days of Exercise per Week:   . Minutes of Exercise per Session:   Stress:   . Feeling of Stress :   Social Connections:   . Frequency of Communication with Friends and Family:   . Frequency of Social Gatherings with Friends and Family:   .  Attends Religious Services:   . Active Member of Clubs or Organizations:   . Attends Archivist Meetings:   Marland Kitchen Marital Status:     Vitals:   10/06/19 0844  BP: 122/84  Pulse: 88  Resp: 16  Temp: 97.6 F (36.4 C)  SpO2: 98%   Body mass index is 55.81 kg/m. Wt Readings from Last 3 Encounters:  10/06/19 (!) 443 lb 8 oz (201.2 kg)  02/03/19 (!) 430 lb 3.2 oz (195.1 kg)  12/23/18 (!) 426 lb (193.2 kg)    Physical Exam  Nursing note and vitals reviewed. Constitutional: She is oriented to person, place, and time. She appears well-developed. She does not appear ill. No distress.  HENT:  Head: Normocephalic and atraumatic.  Nose: Right sinus exhibits no maxillary sinus tenderness. Left sinus exhibits no maxillary sinus tenderness.  Mouth/Throat: Oropharynx is clear and moist and mucous membranes are normal.  Eyes: Pupils are equal, round, and reactive to light. EOM are normal. Left eye exhibits discharge. Left eye exhibits no hordeolum. No foreign body present in the left eye. Left conjunctiva is injected.    Near vision grossly intact,ablr to read instructions on AVS.  Neck: No thyroid mass present.  Cardiovascular: Normal rate.  Pulses:      Dorsalis pedis pulses are 2+ on the right side and 2+ on the left side.  Respiratory: Effort normal. No respiratory distress.  Lymphadenopathy:       Head (right side): No preauricular and no posterior auricular adenopathy present.       Head (left side): No preauricular and no posterior auricular adenopathy present.    She has no cervical adenopathy.  Neurological: She is alert and oriented to person, place, and time. She has normal strength. No cranial nerve deficit. Gait normal.  Skin: Skin is warm. No rash noted. No erythema.  Psychiatric: She has a normal mood and affect.  Well groomed,good eye contact.    ASSESSMENT AND PLAN:  Vickie Mejia was seen today for eye drainage.  Diagnoses and all orders for this  visit:  Bacterial conjunctivitis of left eye Mild. Topical abx recommended. Instructed about warning signs.  -     ciprofloxacin (CILOXAN) 0.3 % ophthalmic solution; Place 1 drop into both eyes every 4 (four) hours while awake for 7 days. Administer 1 drop, every 2 hours, while awake, for 2 days. Then 1 drop, every 4 hours, while awake, for the next 5 days.  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 We discussed benefits of wt loss. Consistency with healthy diet and physical activity is very important. 15 min of walking bid daily recommended. She is planning on moving to a safer area,so she can exercise outdoors.  Allergic conjunctivitis, unspecified laterality Continue Zyrtec 10  mg daily. We could add eye drops if epiphora continues.   Return if symptoms worsen or fail to improve.   Vincenta Steffey G. [place], MD  [facility]. [place] office.  Discharge Instructions   None    A few things to remember from today's visit:  I am recommending antibiotics, it seems to be a mild bacterial infection. Underlying allergic conjunctivitis, if eye tearing continues we can try allergic eyedrops. Monitor for visual changes. Warm compresses may also help.   Please be sure medication list is accurate. If a new problem present, please set up appointment sooner than planned today.

## 2019-10-06 NOTE — Patient Instructions (Addendum)
A few things to remember from today's visit:  I am recommending antibiotics, it seems to be a mild bacterial infection. Underlying allergic conjunctivitis, if eye tearing continues we can try allergic eyedrops. Monitor for visual changes. Warm compresses may also help.   Please be sure medication list is accurate. If a new problem present, please set up appointment sooner than planned today.

## 2020-02-23 NOTE — Progress Notes (Signed)
34 y.o. G0P0000 Single African American female here for annual exam.    Considering stopping birth control.  Thinking about pregnancy for the future.   Currently having light menses on Micronor.  Bleeding for a total of 7 days.  She started her pills for menorrhagia with irregular menses.   Moving her home.  Working from home.   Seeing a therapist and working on her health.  Received her Covid vaccine.   PCP: Betty G. [place], MD    Patient's last menstrual period was 02/15/2020.           Sexually active: No.  Female partner. The current method of family planning is abstinence and oral progesterone-only contraceptive.    Exercising: Yes.    yoga and stretching Smoker:  no  Health Maintenance: Pap: 12/19/17 Neg:Neg HR HPV, 10-20-14 Neg History of abnormal Pap:  no MMG:  n/a Colonoscopy:  n/a BMD:   n/a  Result  n/a TDaP: 12-05-16 Gardasil:   no FFM:BWGYK Hep C: never Screening Labs: PCP   reports that she has never smoked. She has never used smokeless tobacco. She reports current alcohol use. She reports that she does not use drugs.  Past Medical History:  Diagnosis Date  . Asthma   . Elevated hemoglobin A1c June 2017  . Elevated liver function tests June 2017  . Migraines    without aura    Past Surgical History:  Procedure Laterality Date  . WISDOM TOOTH EXTRACTION      Current Outpatient Medications  Medication Sig Dispense Refill  . albuterol (PROVENTIL HFA;VENTOLIN HFA) 108 (90 Base) MCG/ACT inhaler Inhale 2 puffs into the lungs every 6 (six) hours as needed for wheezing or shortness of breath. 1 Inhaler 0  . ASHWAGANDHA PO Take by mouth.    . Cetirizine HCl 10 MG CAPS     . naproxen (NAPROSYN) 500 MG tablet TK 1 T PO  BID PRN. TK MEDICATION WITH FOOD  0  . NON FORMULARY Super Fruits vitamins daily    . norethindrone (HEATHER) 0.35 MG tablet Take 1 tablet (0.35 mg total) by mouth daily. 3 Package 3   No current facility-administered medications for this  visit.    Family History  Problem Relation Age of Onset  . Pancreatic cancer Mother   . Diabetes Mother   . Cancer Mother        pancreatic  . Hypertension Paternal Grandmother   . COPD Paternal Grandmother   . Stroke Paternal Grandfather   . Epilepsy Brother     Review of Systems  Constitutional: Negative.   HENT: Negative.   Eyes: Negative.   Respiratory: Negative.   Cardiovascular: Negative.   Gastrointestinal: Negative.   Endocrine: Negative.   Genitourinary: Negative.   Musculoskeletal: Negative.   Skin: Negative.   Allergic/Immunologic: Negative.   Neurological: Negative.   Hematological: Negative.   Psychiatric/Behavioral: Negative.     Exam:   BP 128/70 (BP Location: Right Arm, Patient Position: Sitting, Cuff Size: Large)   Pulse 76   Resp 16   Ht 6\' 2"  (1.88 m)   Wt (!) 447 lb (202.8 kg)   LMP 02/15/2020   BMI 57.39 kg/m     General appearance: alert, cooperative and appears stated age Head: normocephalic, without obvious abnormality, atraumatic Neck: no adenopathy, supple, symmetrical, trachea midline and thyroid normal to inspection and palpation Lungs: clear to auscultation bilaterally Breasts: normal appearance, no masses or tenderness, No nipple retraction or dimpling, No nipple discharge or bleeding, No axillary  adenopathy Heart: regular rate and rhythm Abdomen: soft, non-tender; no masses, no organomegaly Extremities: extremities normal, atraumatic, no cyanosis or edema Skin: skin color, texture, turgor normal. No rashes or lesions Lymph nodes: cervical, supraclavicular, and axillary nodes normal. Neurologic: grossly normal  Pelvic: External genitalia:  no lesions              No abnormal inguinal nodes palpated.              Urethra:  normal appearing urethra with no masses, tenderness or lesions              Bartholins and Skenes: normal                 Vagina: normal appearing vagina with normal color and discharge, no lesions               Cervix: no lesions.  Mild menstrual flow.               Pap taken: No. Bimanual Exam:  Uterus:  normal size, contour, position, consistency, mobility, non-tender              Adnexa: no mass, fullness, tenderness        Chaperone was present for exam. Zenovia Jordan.   Assessment:   Well woman visit with normal exam. Considering future pregnancy.  On Micronor for cycle control and probable anovulatory cycles. Migraine without aura. Elevated hemoglobin A1C . Candida of flexural folds. Asthma.   Plan: Mammogram screening discussed. Self breast awareness reviewed. Pap and HR HPV as above. Guidelines for Calcium, Vitamin D, regular exercise program including cardiovascular and weight bearing exercise. We talked about future pregnancy planning:  Starting PNV, upcoming advanced maternal age status, ovulation induction, and importance of good blood sugar control.  Refill Micronor for one year.   She will reach out to me when she is getting closer to time of pregnancy planning. See PCP for routine exam, flu vaccine, and lab work.  Follow up annually and prn.   After visit summary provided.

## 2020-02-24 ENCOUNTER — Ambulatory Visit (INDEPENDENT_AMBULATORY_CARE_PROVIDER_SITE_OTHER): Payer: Managed Care, Other (non HMO) | Admitting: Obstetrics and Gynecology

## 2020-02-24 ENCOUNTER — Encounter: Payer: Self-pay | Admitting: Obstetrics and Gynecology

## 2020-02-24 ENCOUNTER — Other Ambulatory Visit: Payer: Self-pay

## 2020-02-24 VITALS — BP 128/70 | HR 76 | Resp 16 | Ht 74.0 in | Wt >= 6400 oz

## 2020-02-24 DIAGNOSIS — Z01419 Encounter for gynecological examination (general) (routine) without abnormal findings: Secondary | ICD-10-CM | POA: Diagnosis not present

## 2020-02-24 MED ORDER — NORETHINDRONE 0.35 MG PO TABS
1.0000 | ORAL_TABLET | Freq: Every day | ORAL | 3 refills | Status: DC
Start: 2020-02-24 — End: 2021-01-25

## 2020-02-24 NOTE — Patient Instructions (Signed)

## 2020-04-25 ENCOUNTER — Observation Stay (HOSPITAL_COMMUNITY)
Admission: EM | Admit: 2020-04-25 | Discharge: 2020-04-27 | Disposition: A | Discharge status: Home / Self Care | Payer: Managed Care, Other (non HMO) | Attending: Orthopedic Surgery | Admitting: Orthopedic Surgery

## 2020-04-25 ENCOUNTER — Other Ambulatory Visit: Payer: Self-pay

## 2020-04-25 ENCOUNTER — Encounter (HOSPITAL_COMMUNITY): Payer: Self-pay

## 2020-04-25 ENCOUNTER — Emergency Department (HOSPITAL_COMMUNITY): Payer: Managed Care, Other (non HMO)

## 2020-04-25 DIAGNOSIS — S8251XA Displaced fracture of medial malleolus of right tibia, initial encounter for closed fracture: Secondary | ICD-10-CM | POA: Diagnosis not present

## 2020-04-25 DIAGNOSIS — S99911A Unspecified injury of right ankle, initial encounter: Secondary | ICD-10-CM | POA: Diagnosis present

## 2020-04-25 DIAGNOSIS — S8252XA Displaced fracture of medial malleolus of left tibia, initial encounter for closed fracture: Secondary | ICD-10-CM

## 2020-04-25 DIAGNOSIS — Z20822 Contact with and (suspected) exposure to covid-19: Secondary | ICD-10-CM | POA: Diagnosis not present

## 2020-04-25 DIAGNOSIS — S82441A Displaced spiral fracture of shaft of right fibula, initial encounter for closed fracture: Secondary | ICD-10-CM | POA: Insufficient documentation

## 2020-04-25 DIAGNOSIS — S82891A Other fracture of right lower leg, initial encounter for closed fracture: Secondary | ICD-10-CM | POA: Diagnosis present

## 2020-04-25 DIAGNOSIS — Y9301 Activity, walking, marching and hiking: Secondary | ICD-10-CM | POA: Diagnosis not present

## 2020-04-25 DIAGNOSIS — S82442A Displaced spiral fracture of shaft of left fibula, initial encounter for closed fracture: Secondary | ICD-10-CM

## 2020-04-25 DIAGNOSIS — J45909 Unspecified asthma, uncomplicated: Secondary | ICD-10-CM | POA: Diagnosis not present

## 2020-04-25 DIAGNOSIS — W010XXA Fall on same level from slipping, tripping and stumbling without subsequent striking against object, initial encounter: Secondary | ICD-10-CM | POA: Insufficient documentation

## 2020-04-25 DIAGNOSIS — Y92009 Unspecified place in unspecified non-institutional (private) residence as the place of occurrence of the external cause: Secondary | ICD-10-CM | POA: Diagnosis not present

## 2020-04-25 DIAGNOSIS — W19XXXA Unspecified fall, initial encounter: Secondary | ICD-10-CM

## 2020-04-25 LAB — CBC
HCT: 41.4 % (ref 36.0–46.0)
Hemoglobin: 13.4 g/dL (ref 12.0–15.0)
MCH: 29.6 pg (ref 26.0–34.0)
MCHC: 32.4 g/dL (ref 30.0–36.0)
MCV: 91.4 fL (ref 80.0–100.0)
Platelets: 368 10*3/uL (ref 150–400)
RBC: 4.53 MIL/uL (ref 3.87–5.11)
RDW: 13.8 % (ref 11.5–15.5)
WBC: 12.4 10*3/uL — ABNORMAL HIGH (ref 4.0–10.5)
nRBC: 0 % (ref 0.0–0.2)

## 2020-04-25 LAB — BASIC METABOLIC PANEL
Anion gap: 10 (ref 5–15)
BUN: 7 mg/dL (ref 6–20)
CO2: 23 mmol/L (ref 22–32)
Calcium: 8.3 mg/dL — ABNORMAL LOW (ref 8.9–10.3)
Chloride: 107 mmol/L (ref 98–111)
Creatinine, Ser: 0.63 mg/dL (ref 0.44–1.00)
GFR, Estimated: >60 mL/min (ref >60)
Glucose, Bld: 91 mg/dL (ref 70–99)
Potassium: 3.3 mmol/L — ABNORMAL LOW (ref 3.5–5.1)
Sodium: 140 mmol/L (ref 135–145)

## 2020-04-25 LAB — RESP PANEL BY RT-PCR (FLU A&B, COVID) ARPGX2
Influenza A by PCR: NEGATIVE
Influenza B by PCR: NEGATIVE
SARS Coronavirus 2 by RT PCR: NEGATIVE

## 2020-04-25 LAB — HIV ANTIBODY (ROUTINE TESTING W REFLEX): HIV Screen 4th Generation wRfx: NONREACTIVE

## 2020-04-25 MED ORDER — SENNOSIDES-DOCUSATE SODIUM 8.6-50 MG PO TABS: 1.0000 | ORAL_TABLET | Freq: Every evening | ORAL | Status: DC | PRN

## 2020-04-25 MED ORDER — ONDANSETRON HCL 4 MG PO TABS: 4.0000 mg | ORAL_TABLET | Freq: Four times a day (QID) | ORAL | Status: DC | PRN

## 2020-04-25 MED ORDER — ONDANSETRON HCL 4 MG/2ML IJ SOLN: 4.0000 mg | Freq: Four times a day (QID) | INTRAMUSCULAR | Status: DC | PRN

## 2020-04-25 MED ORDER — NORETHINDRONE 0.35 MG PO TABS
1.0000 | ORAL_TABLET | Freq: Every day | ORAL | Status: DC
  Administered 2020-04-27: 0.35 mg via ORAL

## 2020-04-25 MED ORDER — ENSURE PRE-SURGERY PO LIQD
296.0000 mL | Freq: Once | ORAL | Status: AC
  Administered 2020-04-26: 296 mL via ORAL
  Filled 2020-04-25: qty 296

## 2020-04-25 MED ORDER — ACETAMINOPHEN 325 MG PO TABS: 325.0000 mg | ORAL_TABLET | Freq: Four times a day (QID) | ORAL | Status: DC | PRN

## 2020-04-25 MED ORDER — BISACODYL 5 MG PO TBEC: 5.0000 mg | DELAYED_RELEASE_TABLET | Freq: Every day | ORAL | Status: DC | PRN

## 2020-04-25 MED ORDER — FENTANYL CITRATE (PF) 100 MCG/2ML IJ SOLN
50.0000 ug | Freq: Once | INTRAMUSCULAR | Status: AC
  Administered 2020-04-25: 50 ug via INTRAVENOUS
  Filled 2020-04-25: qty 2

## 2020-04-25 MED ORDER — HYDROCODONE-ACETAMINOPHEN 5-325 MG PO TABS
1.0000 | ORAL_TABLET | ORAL | Status: DC | PRN
  Administered 2020-04-25 – 2020-04-27 (×6): 2 via ORAL
  Filled 2020-04-25 (×6): qty 2

## 2020-04-25 MED ORDER — DEXTROSE 5 % IV SOLN
3.0000 g | INTRAVENOUS | Status: AC
  Administered 2020-04-26: 3 g via INTRAVENOUS
  Filled 2020-04-25: qty 3

## 2020-04-25 MED ORDER — SODIUM CHLORIDE 0.9 % IV SOLN: INTRAVENOUS | Status: DC

## 2020-04-25 MED ORDER — HYDROCODONE-ACETAMINOPHEN 7.5-325 MG PO TABS: 1.0000 | ORAL_TABLET | ORAL | Status: DC | PRN

## 2020-04-25 MED ORDER — LORATADINE 10 MG PO TABS
10.0000 mg | ORAL_TABLET | Freq: Every day | ORAL | Status: DC
  Administered 2020-04-26 – 2020-04-27 (×2): 10 mg via ORAL
  Filled 2020-04-25 (×2): qty 1

## 2020-04-25 MED ORDER — MORPHINE SULFATE (PF) 2 MG/ML IV SOLN: 0.5000 mg | INTRAVENOUS | Status: DC | PRN

## 2020-04-25 MED ORDER — METHOCARBAMOL 1000 MG/10ML IJ SOLN
500.0000 mg | Freq: Four times a day (QID) | INTRAVENOUS | Status: DC | PRN
  Filled 2020-04-25: qty 5

## 2020-04-25 MED ORDER — POVIDONE-IODINE 10 % EX SWAB: 2.0000 "application " | Freq: Once | CUTANEOUS | Status: DC

## 2020-04-25 MED ORDER — ALBUTEROL SULFATE HFA 108 (90 BASE) MCG/ACT IN AERS: 2.0000 | INHALATION_SPRAY | Freq: Four times a day (QID) | RESPIRATORY_TRACT | Status: DC | PRN

## 2020-04-25 MED ORDER — MAGNESIUM CITRATE PO SOLN: 1.0000 | Freq: Once | ORAL | Status: DC | PRN

## 2020-04-25 MED ORDER — DIPHENHYDRAMINE HCL 12.5 MG/5ML PO ELIX: 12.5000 mg | ORAL_SOLUTION | ORAL | Status: DC | PRN

## 2020-04-25 MED ORDER — METHOCARBAMOL 500 MG PO TABS
500.0000 mg | ORAL_TABLET | Freq: Four times a day (QID) | ORAL | Status: DC | PRN
  Administered 2020-04-27: 500 mg via ORAL
  Filled 2020-04-25: qty 1

## 2020-04-25 MED ORDER — ACETAMINOPHEN 500 MG PO TABS
500.0000 mg | ORAL_TABLET | Freq: Four times a day (QID) | ORAL | Status: AC
  Administered 2020-04-25: 500 mg via ORAL
  Filled 2020-04-25: qty 1

## 2020-04-25 NOTE — ED Provider Notes (Signed)
[facility]-EMERGENCY DEPT Provider Note   CSN: [id number] Arrival date & time: 04/25/20  1436     History Chief Complaint  Patient presents with  . Ankle Pain  . Fall    Vickie Mejia is a 34 y.o. female.  The history is provided by the patient. No language interpreter was used.  Ankle Pain Location:  Ankle Time since incident:  1 hour Injury: yes   Mechanism of injury: fall   Ankle location:  R ankle Pain details:    Quality:  Aching   Severity:  Moderate   Timing:  Constant   Progression:  Worsening Chronicity:  New Relieved by:  Nothing Worsened by:  Nothing Ineffective treatments:  None tried Fall  Pt reports she slipped on leaves and fell.  Pt complains of pain in her ankle.       Past Medical History:  Diagnosis Date  . Asthma   . Elevated hemoglobin A1c June 2017  . Elevated liver function tests June 2017  . Migraines    without aura    Patient Active Problem List   Diagnosis Date Noted  . Ankle pain, chronic 01/30/2017  . Obesity, morbid, BMI 50 or higher ([facility]) 01/30/2017    Past Surgical History:  Procedure Laterality Date  . WISDOM TOOTH EXTRACTION       OB History    Gravida  0   Para  0   Term  0   Preterm  0   AB  0   Living  0     SAB  0   TAB  0   Ectopic  0   Multiple  0   Live Births              Family History  Problem Relation Age of Onset  . Pancreatic cancer Mother   . Diabetes Mother   . Cancer Mother        pancreatic  . Hypertension Paternal Grandmother   . COPD Paternal Grandmother   . Stroke Paternal Grandfather   . Epilepsy Brother     Social History   Tobacco Use  . Smoking status: Never Smoker  . Smokeless tobacco: Never Used  Vaping Use  . Vaping Use: Never used  Substance Use Topics  . Alcohol use: Yes    Alcohol/week: 0.0 - 1.0 standard drinks  . Drug use: No    Home Medications Prior to Admission medications   Medication Sig Start Date  End Date Taking? Authorizing Provider  albuterol (PROVENTIL HFA;VENTOLIN HFA) 108 (90 Base) MCG/ACT inhaler Inhale 2 puffs into the lungs every 6 (six) hours as needed for wheezing or shortness of breath. 08/17/18   Junie Spencer, FNP  ASHWAGANDHA PO Take by mouth.    [provider]  Cetirizine HCl 10 MG CAPS  12/28/18   [provider]  naproxen (NAPROSYN) 500 MG tablet TK 1 T PO  BID PRN. TK MEDICATION WITH FOOD 04/27/18   [provider]  NON FORMULARY Super Fruits vitamins daily    [provider]  norethindrone (HEATHER) 0.35 MG tablet Take 1 tablet (0.35 mg total) by mouth daily. 02/24/20   Patton Salles, MD    Allergies    Sulfa antibiotics and Coconut oil  Review of Systems   Review of Systems  All other systems reviewed and are negative.   Physical Exam Updated Vital Signs BP (!) 171/105 (BP Location: Right Arm)   Pulse 88  Temp 98 F (36.7 C) (Oral)   Resp 18   Ht 6\' 2"  (1.88 m)   Wt (!) 204.1 kg   LMP 04/23/2020   SpO2 96%   BMI 57.78 kg/m   Physical Exam Vitals reviewed.  Constitutional:      Appearance: Normal appearance.  HENT:     Head: Normocephalic.  Musculoskeletal:        General: Swelling, tenderness and deformity present.  Skin:    General: Skin is warm.  Neurological:     General: No focal deficit present.     Mental Status: She is alert.  Psychiatric:        Mood and Affect: Mood normal.     ED Results / Procedures / Treatments   Labs (all labs ordered are listed, but only abnormal results are displayed) Labs Reviewed - No data to display  EKG None  Radiology DG Ankle Right Port  Result Date: 04/25/2020 CLINICAL DATA:  34 year old female with fall and trauma to the right ankle. EXAM: PORTABLE RIGHT ANKLE - 2 VIEW COMPARISON:  None. FINDINGS: There is a mildly displaced and comminuted oblique fracture of the distal fibula with approximately 3 mm medial displacement of the distal fracture  fragment. There is a mildly displaced fracture of the tip of the medial malleolus. There is slight lateral subluxation of the ankle mortise with widening of the medial talar tibial space. There is diffuse soft tissue swelling of the ankle. IMPRESSION: Mildly displaced fractures of the distal fibula and tip of the medial malleolus with slight lateral subluxation of the ankle mortise. Electronically Signed   By: 31 M.D.   On: 04/25/2020 16:33    Procedures Procedures (including critical care time)  Medications Ordered in ED Medications - No data to display  ED Course  I have reviewed the triage vital signs and the nursing notes.  Pertinent labs & imaging results that were available during my care of the patient were reviewed by me and considered in my medical decision making (see chart for details).    MDM Rules/Calculators/A&P                          MDM:  I spoke to Dr. 04/27/2020 who will admit for surgery.   Final Clinical Impression(s) / ED Diagnoses Final diagnoses:  Fall  Closed displaced spiral fracture of shaft of left fibula, initial encounter  Closed displaced fracture of medial malleolus of left tibia, initial encounter    Rx / DC Orders ED Discharge Orders    None       Sena Slate 04/25/20 1752    04/27/20, MD 05/06/20 [phone]

## 2020-04-25 NOTE — H&P (Addendum)
ADMISSION H&P  Chief Complaint: right ankle pain  HPI: Vickie Mejia is a 34 y.o. female with history of migraines and asthma presented to the ED today with right ankle pain. She fell while walking to her apartment earlier today and had immediate pain diffusely at the right ankle. She was unable to get up and a member of her apartment complex staff called EMS who brought her to [facility]. X-rays were performed showing a mildly displaced right distal fibula fracture with avulsion fracture of the tip of the medial malleolus. Patient states right ankle pain is moderate-severe, worse with movement, better with rest and pain medication. She denies chest pain, shortness of breath, nausea, vomiting, paraesthesias or muscle spasms.     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 She lives alone in an apartment on the ground floor.    Past Medical History:  Diagnosis Date  . Asthma   . Elevated hemoglobin A1c June 2017  . Elevated liver function tests June 2017  . Migraines    without aura   Past Surgical History:  Procedure Laterality Date  . WISDOM TOOTH EXTRACTION     Social History   Socioeconomic History  . Marital status: Single    Spouse name: Not on file  . Number of children: Not on file  . Years of education: Not on file  . Highest education level: Not on file  Occupational History  . Not on file  Tobacco Use  . Smoking status: Never Smoker  . Smokeless tobacco: Never Used  Vaping Use  . Vaping Use: Never used  Substance and Sexual Activity  . Alcohol use: Yes    Alcohol/week: 0.0 - 1.0 standard drinks  . Drug use: No  . Sexual activity: Never    Partners: Male    Birth control/protection: Abstinence, Pill    Comment: Heather  Other Topics Concern  . Not on file  Social History Narrative  . Not on file   Social Determinants of Health   Financial Resource Strain:   . Difficulty of Paying Living Expenses: Not on file  Food Insecurity:   . Worried About Patent examiner in the Last Year: Not on file  . Ran Out of Food in the Last Year: Not on file  Transportation Needs:   . Lack of Transportation (Medical): Not on file  . Lack of Transportation (Non-Medical): Not on file  Physical Activity:   . Days of Exercise per Week: Not on file  . Minutes of Exercise per Session: Not on file  Stress:   . Feeling of Stress : Not on file  Social Connections:   . Frequency of Communication with Friends and Family: Not on file  . Frequency of Social Gatherings with Friends and Family: Not on file  . Attends Religious Services: Not on file  . Active Member of Clubs or Organizations: Not on file  . Attends Banker Meetings: Not on file  . Marital Status: Not on file   Family History  Problem Relation Age of Onset  . Pancreatic cancer Mother   . Diabetes Mother   . Cancer Mother        pancreatic  . Hypertension Paternal Grandmother   . COPD Paternal Grandmother   . Stroke Paternal Grandfather   . Epilepsy Brother    Allergies  Allergen Reactions  . Sulfa Antibiotics Itching and Other (See Comments)    Tingly feeling in eye, redness   . Coconut Oil Itching and Swelling  Swelling of the throat --- very mild reaction -- just by mouth    Prior to Admission medications   Medication Sig Start Date End Date Taking? Authorizing Provider  acetaminophen (TYLENOL) 325 MG tablet Take 650 mg by mouth every 6 (six) hours as needed for moderate pain.   Yes [provider]  albuterol (PROVENTIL HFA;VENTOLIN HFA) 108 (90 Base) MCG/ACT inhaler Inhale 2 puffs into the lungs every 6 (six) hours as needed for wheezing or shortness of breath. 08/17/18  Yes Hawks, Christy A, FNP  ASHWAGANDHA PO Take 1 tablet by mouth daily.    Yes [provider]  Cetirizine HCl 10 MG CAPS Take 10 capsules by mouth daily.  12/28/18  Yes [provider]  NON FORMULARY Take 1 tablet by mouth daily. Super Fruits vitamins daily    Yes [provider]  norethindrone (HEATHER) 0.35 MG tablet Take 1 tablet (0.35 mg total) by mouth daily. 02/24/20  Yes Amundson Shirley Friar, MD     Positive ROS: All other systems have been reviewed and were otherwise negative with the exception of those mentioned in the HPI and as above.  Physical Exam: General: Alert, no acute distress, sitting up in chair. Cardiovascular: No pedal edema. Respiratory: No cyanosis, no use of accessory musculature GI: No organomegaly, abdomen is soft and non-tender Skin: No lesions or skin tears at right ankle.  Neurologic: Sensation intact distally Psychiatric: Patient is competent for consent with normal mood and affect Lymphatic: No axillary or cervical lymphadenopathy  MUSCULOSKELETAL: Significant edema at right ankle, no ecchymosis. Able to flex and extend all toes of right foot. + DP pulse, unable to assess PT pulse due to pain at palpation, foot warm and well perfused. Sensation intact to all aspect of right foot. TTP to medial and lateral malleoli.   Assessment: Right closed ankle fracture Morbid obesity with BMI greater than 50  Plan: Discussed with patient the option of discharging today and treating this as an outpatient, but due to patient's BMI as well as lack of support at home, we will plan to admit patient for definitive surgical management tomorrow. Risks, benefits, and alternatives of a right distal fibula ORIF with syndesmotic repair were discussed with patient and she would like to proceed with surgery. I will get in contact with the ortho tech to place a right short leg splint this evening. She will be NPO after midnight in anticipation of surgery tomorrow. NWB RLE.   Armida Sans, PA-C    04/25/2020 7:03 PM

## 2020-04-25 NOTE — ED Triage Notes (Signed)
Per EMS- Patient was walking home from the grocery store and tripped, landing in grass. patient c/o right ankle pain and swelling.

## 2020-04-25 NOTE — Progress Notes (Signed)
Orthopedic Tech Progress Note Patient Details:  Vickie Mejia 10/02/1985 [id number]  Ortho Devices Type of Ortho Device: Ace wrap, Stirrup splint, Post (short leg) splint Ortho Device/Splint Location: RLE Ortho Device/Splint Interventions: Ordered, Application   Post Interventions Patient Tolerated: Well Instructions Provided: Care of device   Jennye Moccasin 04/25/2020, 7:39 PM

## 2020-04-25 NOTE — ED Notes (Signed)
ED TO INPATIENT HANDOFF REPORT  ED Nurse Name and Phone #: Sharene Skeans [phone]  S Name/Age/Gender Vickie Mejia 34 y.o. female Room/Bed: WTR6/WTR6  Code Status   Code Status: Full Code  Home/SNF/Other Home Patient oriented to: self, place, time and situation Is this baseline? Yes   Triage Complete: Triage complete  Chief Complaint Closed right ankle fracture [S82.891A]  Triage Note Per EMS- Patient was walking home from the grocery store and tripped, landing in grass. patient c/o right ankle pain and swelling.    Allergies Allergies  Allergen Reactions  . Sulfa Antibiotics Itching and Other (See Comments)    Tingly feeling in eye, redness   . Coconut Oil Itching and Swelling    Swelling of the throat --- very mild reaction -- just by mouth     Level of Care/Admitting Diagnosis ED Disposition    ED Disposition Condition Comment   Admit  Hospital Area: [facility] [[id number]]  Level of Care: Med-Surg [16]  May admit patient to [facility] or [facility] if equivalent level of care is available:: No  Covid Evaluation: Asymptomatic Screening Protocol (No Symptoms)  Diagnosis: Closed right ankle fracture [132440]  Admitting Physician: Teryl Lucy [[id number]]  Attending Physician: Teryl Lucy [[id number]]  Estimated length of stay: past midnight tomorrow  Certification:: I certify this patient will need inpatient services for at least 2 midnights       B Medical/Surgery History Past Medical History:  Diagnosis Date  . Asthma   . Elevated hemoglobin A1c June 2017  . Elevated liver function tests June 2017  . Migraines    without aura   Past Surgical History:  Procedure Laterality Date  . WISDOM TOOTH EXTRACTION       A IV Location/Drains/Wounds Patient Lines/Drains/Airways Status    Active Line/Drains/Airways    Name Placement date Placement time Site Days   Peripheral IV 04/25/20 Right Antecubital 04/25/20  1445  Antecubital   less than 1          Intake/Output Last 24 hours No intake or output data in the 24 hours ending 04/25/20 1941  Labs/Imaging No results found for this or any previous visit (from the past 48 hour(s)). DG Ankle Right Port  Result Date: 04/25/2020 CLINICAL DATA:  34 year old female with fall and trauma to the right ankle. EXAM: PORTABLE RIGHT ANKLE - 2 VIEW COMPARISON:  None. FINDINGS: There is a mildly displaced and comminuted oblique fracture of the distal fibula with approximately 3 mm medial displacement of the distal fracture fragment. There is a mildly displaced fracture of the tip of the medial malleolus. There is slight lateral subluxation of the ankle mortise with widening of the medial talar tibial space. There is diffuse soft tissue swelling of the ankle. IMPRESSION: Mildly displaced fractures of the distal fibula and tip of the medial malleolus with slight lateral subluxation of the ankle mortise. Electronically Signed   By: Elgie Collard M.D.   On: 04/25/2020 16:33    Pending Labs Unresulted Labs (From admission, onward)          Start     Ordered   04/25/20 1925  Resp Panel by RT-PCR (Flu A&B, Covid) Nasopharyngeal Swab  (Tier 2 (TAT 2 hrs))  Once,   STAT       Question Answer Comment  Is this test for diagnosis or screening Screening   Symptomatic for COVID-19 as defined by CDC No   Hospitalized for COVID-19 No   Admitted to ICU  for COVID-19 No   Previously tested for COVID-19 No   Resident in a congregate (group) care setting No   Employed in healthcare setting No   Pregnant No   Has patient completed COVID vaccination(s) (2 doses of Pfizer/Moderna 1 dose of [employer]) Unknown      04/25/20 1925   04/25/20 1917  CBC  Once,   STAT        04/25/20 1917   04/25/20 1917  Basic metabolic panel  Once,   STAT        04/25/20 1917   04/25/20 1914  HIV Antibody (routine testing w rflx)  (HIV Antibody (Routine testing w reflex) panel)  Once,   STAT         04/25/20 1917   Signed and Held  Resp Panel by RT-PCR (Flu A&B, Covid) Nasopharyngeal Swab  (Asymptomatic - Covid)  ONCE - STAT,   R       Question Answer Comment  Is this test for diagnosis or screening Screening   Symptomatic for COVID-19 as defined by CDC No   Hospitalized for COVID-19 No   Admitted to ICU for COVID-19 No   Previously tested for COVID-19 No   Resident in a congregate (group) care setting No   Employed in healthcare setting No   Pregnant Unknown   Has patient completed COVID vaccination(s) (2 doses of Pfizer/Moderna 1 dose of [employer]) Unknown      Signed and Held          Vitals/Pain Today's Vitals   04/25/20 1445 04/25/20 1446 04/25/20 1454 04/25/20 1906  BP:  (!) 171/105  (!) 171/93  Pulse:  88  82  Resp:  18  19  Temp:  98 F (36.7 C)    TempSrc:  Oral    SpO2: 95% 96%  99%  Weight:  (!) 204.1 kg    Height:  6\' 2"  (1.88 m)    PainSc:   5      Isolation Precautions No active isolations  Medications Medications  acetaminophen (TYLENOL) tablet 325-650 mg (has no administration in time range)  HYDROcodone-acetaminophen (NORCO/VICODIN) 5-325 MG per tablet 1-2 tablet (has no administration in time range)  HYDROcodone-acetaminophen (NORCO) 7.5-325 MG per tablet 1-2 tablet (has no administration in time range)  morphine 2 MG/ML injection 0.5-1 mg (has no administration in time range)  acetaminophen (TYLENOL) tablet 500 mg (has no administration in time range)  methocarbamol (ROBAXIN) tablet 500 mg (has no administration in time range)    Or  methocarbamol (ROBAXIN) 500 mg in dextrose 5 % 50 mL IVPB (has no administration in time range)  diphenhydrAMINE (BENADRYL) 12.5 MG/5ML elixir 12.5-25 mg (has no administration in time range)  senna-docusate (Senokot-S) tablet 1 tablet (has no administration in time range)  bisacodyl (DULCOLAX) EC tablet 5 mg (has no administration in time range)  magnesium citrate solution 1 Bottle (has no administration  in time range)  ondansetron (ZOFRAN) tablet 4 mg (has no administration in time range)    Or  ondansetron (ZOFRAN) injection 4 mg (has no administration in time range)  0.9 %  sodium chloride infusion (has no administration in time range)  fentaNYL (SUBLIMAZE) injection 50 mcg (50 mcg Intravenous Given 04/25/20 1904)    Mobility non-ambulatory Low fall risk   Focused Assessments Gen Med   R Recommendations: See Admitting Provider Note  Report given to:   Additional Notes:

## 2020-04-25 NOTE — ED Notes (Signed)
Transport called to move pt upstairs  

## 2020-04-26 ENCOUNTER — Encounter (HOSPITAL_COMMUNITY)
Admission: EM | Disposition: A | Discharge status: Home / Self Care | Payer: Self-pay | Source: Home / Self Care | Attending: Emergency Medicine

## 2020-04-26 ENCOUNTER — Inpatient Hospital Stay: Admit: 2020-04-26 | Payer: Managed Care, Other (non HMO) | Admitting: Orthopedic Surgery

## 2020-04-26 ENCOUNTER — Observation Stay (HOSPITAL_COMMUNITY): Payer: Managed Care, Other (non HMO) | Admitting: Registered Nurse

## 2020-04-26 ENCOUNTER — Encounter (HOSPITAL_COMMUNITY): Payer: Self-pay | Admitting: Orthopedic Surgery

## 2020-04-26 ENCOUNTER — Observation Stay (HOSPITAL_COMMUNITY): Payer: Managed Care, Other (non HMO)

## 2020-04-26 DIAGNOSIS — S82891A Other fracture of right lower leg, initial encounter for closed fracture: Secondary | ICD-10-CM | POA: Diagnosis present

## 2020-04-26 HISTORY — PX: ORIF ANKLE FRACTURE: SHX5408

## 2020-04-26 LAB — MRSA PCR SCREENING: MRSA by PCR: NEGATIVE

## 2020-04-26 SURGERY — OPEN REDUCTION INTERNAL FIXATION (ORIF) ANKLE FRACTURE
Anesthesia: General | Site: Ankle | Laterality: Right

## 2020-04-26 MED ORDER — FENTANYL CITRATE (PF) 100 MCG/2ML IJ SOLN
50.0000 ug | INTRAMUSCULAR | Status: DC
  Administered 2020-04-26: 100 ug via INTRAVENOUS
  Filled 2020-04-26: qty 2

## 2020-04-26 MED ORDER — 0.9 % SODIUM CHLORIDE (POUR BTL) OPTIME
TOPICAL | Status: DC | PRN
  Administered 2020-04-26: 1000 mL

## 2020-04-26 MED ORDER — AMISULPRIDE (ANTIEMETIC) 5 MG/2ML IV SOLN: 10.0000 mg | Freq: Once | INTRAVENOUS | Status: DC | PRN

## 2020-04-26 MED ORDER — BUPIVACAINE-EPINEPHRINE (PF) 0.5% -1:200000 IJ SOLN
INTRAMUSCULAR | Status: DC | PRN
  Administered 2020-04-26: 30 mL via PERINEURAL

## 2020-04-26 MED ORDER — DEXAMETHASONE SODIUM PHOSPHATE 10 MG/ML IJ SOLN
INTRAMUSCULAR | Status: AC
  Filled 2020-04-26: qty 1

## 2020-04-26 MED ORDER — SENNA 8.6 MG PO TABS
1.0000 | ORAL_TABLET | Freq: Two times a day (BID) | ORAL | Status: DC
  Administered 2020-04-27 (×2): 8.6 mg via ORAL
  Filled 2020-04-26 (×2): qty 1

## 2020-04-26 MED ORDER — LACTATED RINGERS IV SOLN: INTRAVENOUS | Status: DC

## 2020-04-26 MED ORDER — TRANEXAMIC ACID-NACL 1000-0.7 MG/100ML-% IV SOLN
INTRAVENOUS | Status: AC
  Filled 2020-04-26: qty 100

## 2020-04-26 MED ORDER — SCOPOLAMINE 1 MG/3DAYS TD PT72
MEDICATED_PATCH | TRANSDERMAL | Status: DC | PRN
  Administered 2020-04-26: 1 via TRANSDERMAL

## 2020-04-26 MED ORDER — SCOPOLAMINE 1 MG/3DAYS TD PT72
MEDICATED_PATCH | TRANSDERMAL | Status: AC
  Filled 2020-04-26: qty 1

## 2020-04-26 MED ORDER — ONDANSETRON HCL 4 MG/2ML IJ SOLN
INTRAMUSCULAR | Status: AC
  Filled 2020-04-26: qty 2

## 2020-04-26 MED ORDER — METOCLOPRAMIDE HCL 5 MG/ML IJ SOLN: 5.0000 mg | Freq: Three times a day (TID) | INTRAMUSCULAR | Status: DC | PRN

## 2020-04-26 MED ORDER — POTASSIUM CHLORIDE IN NACL 20-0.45 MEQ/L-% IV SOLN
INTRAVENOUS | Status: DC
  Filled 2020-04-26: qty 1000

## 2020-04-26 MED ORDER — MIDAZOLAM HCL 5 MG/5ML IJ SOLN
INTRAMUSCULAR | Status: DC | PRN
  Administered 2020-04-26: 2 mg via INTRAVENOUS

## 2020-04-26 MED ORDER — FENTANYL CITRATE (PF) 100 MCG/2ML IJ SOLN
INTRAMUSCULAR | Status: AC
  Filled 2020-04-26: qty 2

## 2020-04-26 MED ORDER — ONDANSETRON HCL 4 MG/2ML IJ SOLN
INTRAMUSCULAR | Status: DC | PRN
  Administered 2020-04-26: 4 mg via INTRAVENOUS

## 2020-04-26 MED ORDER — ZOLPIDEM TARTRATE 5 MG PO TABS: 5.0000 mg | ORAL_TABLET | Freq: Every evening | ORAL | Status: DC | PRN

## 2020-04-26 MED ORDER — DEXAMETHASONE SODIUM PHOSPHATE 10 MG/ML IJ SOLN
INTRAMUSCULAR | Status: DC | PRN
  Administered 2020-04-26: 10 mg via INTRAVENOUS

## 2020-04-26 MED ORDER — PROPOFOL 1000 MG/100ML IV EMUL
INTRAVENOUS | Status: AC
  Filled 2020-04-26: qty 100

## 2020-04-26 MED ORDER — SUGAMMADEX SODIUM 500 MG/5ML IV SOLN
INTRAVENOUS | Status: DC | PRN
  Administered 2020-04-26: 500 mg via INTRAVENOUS

## 2020-04-26 MED ORDER — ROCURONIUM BROMIDE 100 MG/10ML IV SOLN
INTRAVENOUS | Status: DC | PRN
  Administered 2020-04-26: 30 mg via INTRAVENOUS
  Administered 2020-04-26: 60 mg via INTRAVENOUS
  Administered 2020-04-26: 20 mg via INTRAVENOUS

## 2020-04-26 MED ORDER — METOCLOPRAMIDE HCL 5 MG PO TABS: 5.0000 mg | ORAL_TABLET | Freq: Three times a day (TID) | ORAL | Status: DC | PRN

## 2020-04-26 MED ORDER — MIDAZOLAM HCL 2 MG/2ML IJ SOLN
1.0000 mg | INTRAMUSCULAR | Status: DC
  Administered 2020-04-26: 2 mg via INTRAVENOUS
  Filled 2020-04-26: qty 2

## 2020-04-26 MED ORDER — PROPOFOL 500 MG/50ML IV EMUL
INTRAVENOUS | Status: DC | PRN
  Administered 2020-04-26: 75 ug/kg/min via INTRAVENOUS

## 2020-04-26 MED ORDER — MIDAZOLAM HCL 2 MG/2ML IJ SOLN
INTRAMUSCULAR | Status: AC
  Filled 2020-04-26: qty 2

## 2020-04-26 MED ORDER — CHLORHEXIDINE GLUCONATE 0.12 % MT SOLN
15.0000 mL | Freq: Once | OROMUCOSAL | Status: AC
  Administered 2020-04-26: 15 mL via OROMUCOSAL

## 2020-04-26 MED ORDER — PHENYLEPHRINE 40 MCG/ML (10ML) SYRINGE FOR IV PUSH (FOR BLOOD PRESSURE SUPPORT)
PREFILLED_SYRINGE | INTRAVENOUS | Status: AC
  Filled 2020-04-26: qty 10

## 2020-04-26 MED ORDER — DOCUSATE SODIUM 100 MG PO CAPS
100.0000 mg | ORAL_CAPSULE | Freq: Two times a day (BID) | ORAL | Status: DC
  Administered 2020-04-27 (×2): 100 mg via ORAL
  Filled 2020-04-26 (×2): qty 1

## 2020-04-26 MED ORDER — PHENYLEPHRINE HCL (PRESSORS) 10 MG/ML IV SOLN
INTRAVENOUS | Status: DC | PRN
  Administered 2020-04-26: 80 ug via INTRAVENOUS

## 2020-04-26 MED ORDER — POLYETHYLENE GLYCOL 3350 17 G PO PACK: 17.0000 g | PACK | Freq: Every day | ORAL | Status: DC | PRN

## 2020-04-26 MED ORDER — PROPOFOL 10 MG/ML IV BOLUS
INTRAVENOUS | Status: DC | PRN
  Administered 2020-04-26: 300 mg via INTRAVENOUS
  Administered 2020-04-26: 50 mg via INTRAVENOUS

## 2020-04-26 MED ORDER — ROCURONIUM BROMIDE 10 MG/ML (PF) SYRINGE
PREFILLED_SYRINGE | INTRAVENOUS | Status: AC
  Filled 2020-04-26: qty 10

## 2020-04-26 MED ORDER — LIDOCAINE HCL (PF) 2 % IJ SOLN
INTRAMUSCULAR | Status: AC
  Filled 2020-04-26: qty 5

## 2020-04-26 MED ORDER — PROPOFOL 10 MG/ML IV BOLUS
INTRAVENOUS | Status: AC
  Filled 2020-04-26: qty 40

## 2020-04-26 MED ORDER — CLONIDINE HCL (ANALGESIA) 100 MCG/ML EP SOLN
EPIDURAL | Status: DC | PRN
  Administered 2020-04-26 (×2): 50 ug

## 2020-04-26 MED ORDER — LIDOCAINE HCL (PF) 2 % IJ SOLN
INTRAMUSCULAR | Status: DC | PRN
  Administered 2020-04-26: 25 mg via INTRADERMAL

## 2020-04-26 MED ORDER — BISACODYL 10 MG RE SUPP: 10.0000 mg | Freq: Every day | RECTAL | Status: DC | PRN

## 2020-04-26 MED ORDER — CEFAZOLIN SODIUM-DEXTROSE 2-4 GM/100ML-% IV SOLN
2.0000 g | Freq: Four times a day (QID) | INTRAVENOUS | Status: AC
  Administered 2020-04-27 (×3): 2 g via INTRAVENOUS
  Filled 2020-04-26 (×3): qty 100

## 2020-04-26 MED ORDER — ROPIVACAINE HCL 5 MG/ML IJ SOLN
INTRAMUSCULAR | Status: DC | PRN
  Administered 2020-04-26: 20 mL via PERINEURAL

## 2020-04-26 MED ORDER — FENTANYL CITRATE (PF) 250 MCG/5ML IJ SOLN
INTRAMUSCULAR | Status: AC
  Filled 2020-04-26: qty 5

## 2020-04-26 MED ORDER — FENTANYL CITRATE (PF) 100 MCG/2ML IJ SOLN
INTRAMUSCULAR | Status: DC | PRN
  Administered 2020-04-26 (×2): 100 ug via INTRAVENOUS
  Administered 2020-04-26: 50 ug via INTRAVENOUS
  Administered 2020-04-26: 100 ug via INTRAVENOUS

## 2020-04-26 MED ORDER — FENTANYL CITRATE (PF) 100 MCG/2ML IJ SOLN: 25.0000 ug | INTRAMUSCULAR | Status: DC | PRN

## 2020-04-26 SURGICAL SUPPLY — 52 items
APL SKNCLS STERI-STRIP NONHPOA (GAUZE/BANDAGES/DRESSINGS)
BAG SPEC THK2 15X12 ZIP CLS (MISCELLANEOUS) ×1
BAG ZIPLOCK 12X15 (MISCELLANEOUS) ×3 IMPLANT
BENZOIN TINCTURE PRP APPL 2/3 (GAUZE/BANDAGES/DRESSINGS) ×1 IMPLANT
BIT DRILL 2.5X2.75 QC CALB (BIT) ×2 IMPLANT
BIT DRILL 3.5X5.5 QC CALB (BIT) ×2 IMPLANT
BIT DRILL CALIBRATED 2.7 (BIT) ×1 IMPLANT
BIT DRILL CALIBRATED 2.7MM (BIT) ×1
BNDG ELASTIC 4X5.8 VLCR STR LF (GAUZE/BANDAGES/DRESSINGS) ×4 IMPLANT
CLOSURE WOUND 1/2 X4 (GAUZE/BANDAGES/DRESSINGS)
COVER SURGICAL LIGHT HANDLE (MISCELLANEOUS) ×3 IMPLANT
COVER WAND RF STERILE (DRAPES) IMPLANT
CUFF TOURN SGL QUICK 34 (TOURNIQUET CUFF) ×3
CUFF TRNQT CYL 34X4X40X1 (TOURNIQUET CUFF) IMPLANT
DEVICE FIXATION SYNDESMOSIS (Bone Implant) ×2 IMPLANT
DRAPE C-ARM 42X120 X-RAY (DRAPES) ×3 IMPLANT
DRAPE U-SHAPE 47X51 STRL (DRAPES) ×3 IMPLANT
DRSG ADAPTIC 3X8 NADH LF (GAUZE/BANDAGES/DRESSINGS) ×3 IMPLANT
DRSG PAD ABDOMINAL 8X10 ST (GAUZE/BANDAGES/DRESSINGS) ×5 IMPLANT
ELECT REM PT RETURN 15FT ADLT (MISCELLANEOUS) ×3 IMPLANT
FIXATION ZIPTIGHT ANKLE SNDSMS (Ankle) IMPLANT
GAUZE SPONGE 4X4 12PLY STRL (GAUZE/BANDAGES/DRESSINGS) ×3 IMPLANT
GAUZE XEROFORM 1X8 LF (GAUZE/BANDAGES/DRESSINGS) ×2 IMPLANT
GLOVE BIOGEL PI IND STRL 8 (GLOVE) ×1 IMPLANT
GLOVE BIOGEL PI INDICATOR 8 (GLOVE) ×2
GLOVE ECLIPSE 7.5 STRL STRAW (GLOVE) ×3 IMPLANT
GLOVE ORTHO TXT STRL SZ7.5 (GLOVE) ×3 IMPLANT
GOWN STRL REUS W/TWL LRG LVL3 (GOWN DISPOSABLE) ×3 IMPLANT
KIT TURNOVER KIT A (KITS) IMPLANT
PACK ORTHO EXTREMITY (CUSTOM PROCEDURE TRAY) ×3 IMPLANT
PAD CAST 4YDX4 CTTN HI CHSV (CAST SUPPLIES) ×2 IMPLANT
PADDING CAST COTTON 4X4 STRL (CAST SUPPLIES)
PENCIL SMOKE EVACUATOR (MISCELLANEOUS) IMPLANT
PLATE LOCK 8H 103 BILAT FIB (Plate) ×2 IMPLANT
PROTECTOR NERVE ULNAR (MISCELLANEOUS) ×3 IMPLANT
SCREW CORTICAL 3.5MM 22MM (Screw) ×2 IMPLANT
SCREW LOCK 3.5X14 DIST TIB (Screw) ×4 IMPLANT
SCREW LOCK CORT STAR 3.5X14 (Screw) ×2 IMPLANT
SCREW LOCK CORT STAR 3.5X16 (Screw) ×2 IMPLANT
SCREW LOW PROFILE 18MMX3.5MM (Screw) IMPLANT
SCREW NON LOCKING LP 3.5 16MM (Screw) ×4 IMPLANT
STRIP CLOSURE SKIN 1/2X4 (GAUZE/BANDAGES/DRESSINGS) ×1 IMPLANT
SUCTION FRAZIER HANDLE 10FR (MISCELLANEOUS) ×3
SUCTION TUBE FRAZIER 10FR DISP (MISCELLANEOUS) IMPLANT
SUT ETHILON 3 0 PS 1 (SUTURE) ×6 IMPLANT
SUT MNCRL AB 4-0 PS2 18 (SUTURE) ×3 IMPLANT
SUT VIC AB 2-0 CT1 27 (SUTURE) ×3
SUT VIC AB 2-0 CT1 TAPERPNT 27 (SUTURE) IMPLANT
SUT VIC AB 3-0 SH 8-18 (SUTURE) ×3 IMPLANT
SYR BULB EAR ULCER 3OZ GRN STR (SYRINGE) ×2 IMPLANT
TOWEL OR 17X26 10 PK STRL BLUE (TOWEL DISPOSABLE) ×6 IMPLANT
ZIPTIGHT ANKLE SYNODESMOSS FIX (Ankle) ×3 IMPLANT

## 2020-04-26 NOTE — Plan of Care (Signed)
  Problem: Education: Goal: Knowledge of General Education information will improve Description Including pain rating scale, medication(s)/side effects and non-pharmacologic comfort measures Outcome: Progressing   

## 2020-04-26 NOTE — Anesthesia Procedure Notes (Signed)
Anesthesia Regional Block: Adductor canal block   Pre-Anesthetic Checklist: ,, timeout performed, Correct Patient, Correct Site, Correct Laterality, Correct Procedure, Correct Position, site marked, Risks and benefits discussed,  Surgical consent,  Pre-op evaluation,  At surgeon's request and post-op pain management  Laterality: Right  Prep: chloraprep       Needles:  Injection technique: Single-shot  Needle Type: Echogenic Needle     Needle Length: 9cm  Needle Gauge: 21     Additional Needles:   Procedures:,,,, ultrasound used (permanent image in chart),,,,  Narrative:  Start time: 04/26/2020 4:40 PM End time: 04/26/2020 4:46 PM Injection made incrementally with aspirations every 5 mL.  Performed by: Personally  Anesthesiologist: Marcene Duos, MD

## 2020-04-26 NOTE — Interval H&P Note (Signed)
History and Physical Interval Note:  04/26/2020 4:55 PM  Vickie Mejia  has presented today for surgery, with the diagnosis of ankle fracture.  The various methods of treatment have been discussed with the patient and family. After consideration of risks, benefits and other options for treatment, the patient has consented to  Procedure(s): OPEN REDUCTION INTERNAL FIXATION (ORIF) ANKLE FRACTURE WITH SYNDESYNOTIC REPAIR (Right) as a surgical intervention.  The patient's history has been reviewed, patient examined, no change in status, stable for surgery.  I have reviewed the patient's chart and labs.  Questions were answered to the patient's satisfaction.     Eulas Post

## 2020-04-26 NOTE — Op Note (Signed)
04/25/2020 - 04/26/2020  PATIENT:  Vickie Mejia    PRE-OPERATIVE DIAGNOSIS: Right ankle bimalleolar fracture with syndesmotic disruption  POST-OPERATIVE DIAGNOSIS:  Same  PROCEDURE:    1.  Open reduction internal fixation bimalleolar ankle fracture with fixation of the fibula 2.  Open reduction internal fixation right syndesmosis 3.  3 views plus a stress view of the right ankle  SURGEON:  Eulas Post, MD  PHYSICIAN ASSISTANT: Janine Ores, PA-C, present and scrubbed throughout the case, critical for completion in a timely fashion, and for retraction, instrumentation, and closure.  ANESTHESIA:   General  ESTIMATED BLOOD LOSS: 75 mL  UNIQUE ASPECTS OF THE CASE: Bone quality was overall reasonable.  I got a good lag screw.  I used a bio composite plate for additional strength given her elevated BMI, with a body weight of approximately 450 pounds.  Body mass index is 57.78 kg/m.   PREOPERATIVE INDICATIONS:  Vickie Mejia is a  34 y.o. female with a diagnosis of ankle fracture who elected for surgical management to minimize the risk for malunion and nonunion and post-traumatic arthritis.    The risks benefits and alternatives were discussed with the patient preoperatively including but not limited to the risks of infection, bleeding, nerve injury, cardiopulmonary complications, the need for revision surgery, the need for hardware removal, among others, and the patient was willing to proceed.  She is at exceedingly increased risk for hardware failure given her body habitus.  OPERATIVE IMPLANTS: Biomet / Zimmer bio composite titanium locking plate with 2 proximal nonlocking screws, 1 proximal locking screw, 2 distal locking screws, 1 interfragmentary lag screw, and a total of 2 zip tight titanium implants.  OPERATIVE PROCEDURE: The patient was brought to the operating room and placed in the supine position. All bony prominences were padded. General anesthesia was  administered. The lower extremity was prepped and draped in the usual sterile fashion.  Tourniquet was not utilized. Time out was performed.   Incision was made over the distal fibula and the fracture was exposed and reduced anatomically with a clamp. A lag screw was placed.  I then contoured the titanium plate to fit the bone, secured it initially proximally with a nonlocking screw and then I did use a nonlocking screw drilled through the locking hole distally, which I ultimately switched out in order to augment my distal fixation.  I wanted to use the third hole even though it was distal to the fracture for the syndesmotic fixation.  It was in the correct position.  Therefore I ended up with 2 distal locking screws after I had sucked the plate down to the bone, and then I finished the fixation proximally with nonlocking screw at the most proximal hole, and then a locking screw third from the top.  I used c-arm to confirm satisfactory reduction and fixation.   Her syndesmosis was unstable based on preoperative radiographs so I then turned my attention to the syndesmosis.  I used a guidewire approximately 1 cm above the mortise, and exiting in the midline of the tibia and then drilled, and then passed my implant, flipped the button, and used manual tension to reduce the syndesmosis.  Excellent fixation was achieved.  I used a second button for additional augmentation.  The wounds were irrigated, and closed with vicryl with routine closure for the skin. The wounds were injected with local anesthetic. Sterile gauze was applied followed by a posterior splint. She was awakened and returned to the PACU in stable  and satisfactory condition. There were no complications.  3-Views plus a stress view of the ankle demonstrated appropriate reconstruction of the mortise with internal fixation and appropriate stability of the syndesmosis.

## 2020-04-26 NOTE — Transfer of Care (Signed)
Immediate Anesthesia Transfer of Care Note  Patient: Vickie Mejia  Procedure(s) Performed: Procedure(s): OPEN REDUCTION INTERNAL FIXATION (ORIF) ANKLE FRACTURE WITH SYNDESYNOTIC REPAIR (Right)  Patient Location: PACU  Anesthesia Type:General  Level of Consciousness: Alert, Awake, Oriented  Airway & Oxygen Therapy: Patient Spontanous Breathing  Post-op Assessment: Report given to RN  Post vital signs: Reviewed and stable  Last Vitals:  Vitals:   04/26/20 1650 04/26/20 1655  BP: 132/87 132/75  Pulse: 73 79  Resp: 20 (!) 21  Temp:    SpO2: 100% 100%    Complications: No apparent anesthesia complications

## 2020-04-26 NOTE — Anesthesia Preprocedure Evaluation (Signed)
Anesthesia Evaluation  Patient identified by MRN, date of birth, ID band Patient awake    Reviewed: Allergy & Precautions, NPO status , Patient's Chart, lab work & pertinent test results  Airway Mallampati: II  TM Distance: >3 FB     Dental  (+) Dental Advisory Given   Pulmonary asthma ,    breath sounds clear to auscultation       Cardiovascular negative cardio ROS   Rhythm:Regular Rate:Normal     Neuro/Psych  Headaches,    GI/Hepatic negative GI ROS, Neg liver ROS,   Endo/Other  Morbid obesity  Renal/GU negative Renal ROS     Musculoskeletal   Abdominal   Peds  Hematology negative hematology ROS (+)   Anesthesia Other Findings   Reproductive/Obstetrics                             Anesthesia Physical Anesthesia Plan  ASA: III  Anesthesia Plan: General   Post-op Pain Management:  Regional for Post-op pain   Induction: Intravenous  PONV Risk Score and Plan: 3 and Ondansetron, Dexamethasone, Midazolam and Treatment may vary due to age or medical condition  Airway Management Planned: Oral ETT  Additional Equipment: None  Intra-op Plan:   Post-operative Plan: Extubation in OR  Informed Consent: I have reviewed the patients History and Physical, chart, labs and discussed the procedure including the risks, benefits and alternatives for the proposed anesthesia with the patient or authorized representative who has indicated his/her understanding and acceptance.     Dental advisory given  Plan Discussed with: CRNA  Anesthesia Plan Comments:         Anesthesia Quick Evaluation

## 2020-04-26 NOTE — Anesthesia Postprocedure Evaluation (Signed)
Anesthesia Post Note  Patient: Vickie Mejia  Procedure(s) Performed: OPEN REDUCTION INTERNAL FIXATION (ORIF) ANKLE FRACTURE WITH SYNDESYNOTIC REPAIR (Right Ankle)     Patient location during evaluation: PACU Anesthesia Type: General Level of consciousness: awake and alert Pain management: pain level controlled Vital Signs Assessment: post-procedure vital signs reviewed and stable Respiratory status: spontaneous breathing, nonlabored ventilation, respiratory function stable and patient connected to nasal cannula oxygen Cardiovascular status: blood pressure returned to baseline and stable Postop Assessment: no apparent nausea or vomiting Anesthetic complications: no   No complications documented.  Last Vitals:  Vitals:   04/26/20 2137 04/26/20 2200  BP: (!) 148/88 (!) 147/92  Pulse: 83 80  Resp: 17 15  Temp:  36.9 C  SpO2: 98% 95%    Last Pain:  Vitals:   04/26/20 2045  TempSrc:   PainSc: 0-No pain                 Kennieth Rad

## 2020-04-26 NOTE — Plan of Care (Signed)

## 2020-04-26 NOTE — Anesthesia Procedure Notes (Signed)
Procedure Name: Intubation Date/Time: 04/26/2020 6:14 PM Performed by: Lissa Morales, CRNA Pre-anesthesia Checklist: Patient identified, Emergency Drugs available, Suction available and Patient being monitored Patient Re-evaluated:Patient Re-evaluated prior to induction Oxygen Delivery Method: Circle system utilized Preoxygenation: Pre-oxygenation with 100% oxygen Induction Type: IV induction Ventilation: Mask ventilation without difficulty Laryngoscope Size: Mac and 4 Grade View: Grade II Tube type: Oral Tube size: 7.5 mm Number of attempts: 1 Airway Equipment and Method: Stylet and Oral airway Placement Confirmation: ETT inserted through vocal cords under direct vision,  positive ETCO2 and breath sounds checked- equal and bilateral Secured at: 22 cm Tube secured with: Tape Dental Injury: Teeth and Oropharynx as per pre-operative assessment

## 2020-04-26 NOTE — Progress Notes (Signed)
Assisted Dr. Casilda Carls with right, ultrasound guided popliteal and adductor canal block. Side rails up, monitors on throughout procedure. See vital signs in flow sheet. Tolerated Procedure well.

## 2020-04-26 NOTE — Anesthesia Procedure Notes (Signed)
Anesthesia Regional Block: Popliteal block   Pre-Anesthetic Checklist: ,, timeout performed, Correct Patient, Correct Site, Correct Laterality, Correct Procedure, Correct Position, site marked, Risks and benefits discussed,  Surgical consent,  Pre-op evaluation,  At surgeon's request and post-op pain management  Laterality: Right  Prep: chloraprep       Needles:  Injection technique: Single-shot  Needle Type: Echogenic Stimulator Needle     Needle Length: 9cm  Needle Gauge: 21     Additional Needles:   Procedures:, nerve stimulator,,, ultrasound used (permanent image in chart),,,,   Nerve Stimulator or Paresthesia:  Response: plantar flexion, 0.5 mA,   Additional Responses:   Narrative:  Start time: 04/26/2020 4:33 PM End time: 04/26/2020 4:39 PM Injection made incrementally with aspirations every 5 mL.  Performed by: Personally  Anesthesiologist: Marcene Duos, MD

## 2020-04-27 LAB — CBC
HCT: 40.8 % (ref 36.0–46.0)
Hemoglobin: 13.2 g/dL (ref 12.0–15.0)
MCH: 29.3 pg (ref 26.0–34.0)
MCHC: 32.4 g/dL (ref 30.0–36.0)
MCV: 90.7 fL (ref 80.0–100.0)
Platelets: 354 10*3/uL (ref 150–400)
RBC: 4.5 MIL/uL (ref 3.87–5.11)
RDW: 13.8 % (ref 11.5–15.5)
WBC: 10.2 10*3/uL (ref 4.0–10.5)
nRBC: 0 % (ref 0.0–0.2)

## 2020-04-27 LAB — BASIC METABOLIC PANEL
Anion gap: 10 (ref 5–15)
BUN: 8 mg/dL (ref 6–20)
CO2: 24 mmol/L (ref 22–32)
Calcium: 9.1 mg/dL (ref 8.9–10.3)
Chloride: 105 mmol/L (ref 98–111)
Creatinine, Ser: 0.73 mg/dL (ref 0.44–1.00)
GFR, Estimated: >60 mL/min (ref >60)
Glucose, Bld: 142 mg/dL — ABNORMAL HIGH (ref 70–99)
Potassium: 3.7 mmol/L (ref 3.5–5.1)
Sodium: 139 mmol/L (ref 135–145)

## 2020-04-27 MED ORDER — POLYETHYLENE GLYCOL 3350 17 G PO PACK: 17.0000 g | PACK | Freq: Every day | ORAL | Status: DC | PRN

## 2020-04-27 MED ORDER — BACLOFEN 10 MG PO TABS: 10.0000 mg | ORAL_TABLET | Freq: Three times a day (TID) | ORAL | 0 refills | Status: DC

## 2020-04-27 MED ORDER — HYDROCODONE-ACETAMINOPHEN 5-325 MG PO TABS
1.0000 | ORAL_TABLET | Freq: Four times a day (QID) | ORAL | 0 refills | Status: DC | PRN
Start: 2020-04-27 — End: 2021-03-29

## 2020-04-27 MED ORDER — ONDANSETRON HCL 4 MG PO TABS: 4.0000 mg | ORAL_TABLET | Freq: Three times a day (TID) | ORAL | 0 refills | Status: DC | PRN

## 2020-04-27 MED ORDER — SENNOSIDES-DOCUSATE SODIUM 8.6-50 MG PO TABS: 1.0000 | ORAL_TABLET | Freq: Every evening | ORAL | Status: DC | PRN

## 2020-04-27 NOTE — Plan of Care (Signed)
  Problem: Education: Goal: Knowledge of General Education information will improve Description: Including pain rating scale, medication(s)/side effects and non-pharmacologic comfort measures Outcome: Progressing   Problem: Clinical Measurements: Goal: Will remain free from infection Outcome: Progressing   Problem: Activity: Goal: Risk for activity intolerance will decrease Outcome: Progressing   

## 2020-04-27 NOTE — Progress Notes (Addendum)
Patient would like to discharge home with HHPT rather than pursue a inpatient rehab consult.  Confirmed patient has both aunt and father at home today for safe discharge. Order placed for 3n1 and walker. Stat TOC consult placed in order to set up HHPT. Once HHPT is set up, patient ok to discharge home.   Order for knee walker has been sent to [facility]. Information for patient to call [facility] for pick up of knee walker has been included in her discharge paperwork.

## 2020-04-27 NOTE — Progress Notes (Signed)
Physical Therapy Treatment Patient Details Name: Vickie Mejia MRN: [id number] DOB: 1985/12/22 Today's Date: 04/27/2020    History of Present Illness Pt is a 34 year old female s/p OPEN REDUCTION INTERNAL FIXATION (ORIF) ANKLE FRACTURE WITH SYNDESYNOTIC REPAIR    PT Comments    Pt used knee scooter and having difficulty with turning however no physical assist required.  Discussed home set up and ability to function.  Pt states scooter likely won't be good for bathroom so she can keep RW there and especially use knee scooter for getting to her appointments.  Pt also would like BSC to have armrests and elevated toilet seat.  Pt feels like she will be able to function with discussed equipment and states her Dad and aunt will be able to take turns with assisting her.  Recommended pt save physical therapy visits (depending on finances and insurance) for when ankle is less/ not restricted for the most benefit.  Pt aware to keep right ankle NWB (including when sitting).       Follow Up Recommendations  Supervision/Assistance - 24 hour     Equipment Recommendations  Rolling walker with 5" wheels;3in1 (PT) (knee scooter, equipment for pt height (tall, 6'2"))    Recommendations for Other Services       Precautions / Restrictions Precautions Precautions: Fall Restrictions Weight Bearing Restrictions: Yes RLE Weight Bearing: Non weight bearing    Mobility  Bed Mobility Overal bed mobility: Needs Assistance Bed Mobility: Supine to Sit;Sit to Supine     Supine to sit: Supervision Sit to supine: Supervision   General bed mobility comments: effortful  Transfers Overall transfer level: Needs assistance Equipment used:  (knee scooter) Transfers: Sit to/from Stand Sit to Stand: Min guard         General transfer comment: verbal cues for UE and LE positioning, pt aware of NWB status, increased time for positioning with knee scooter  Ambulation/Gait Ambulation/Gait  assistance: Min guard Gait Distance (Feet): 120 Feet Assistive device:  (knee scooter)       General Gait Details: cues for use of knee scooter and safety including brakes, cues for turning   Stairs             Wheelchair Mobility    Modified Rankin (Stroke Patients Only)       Balance                                            Cognition Arousal/Alertness: Awake/alert Behavior During Therapy: WFL for tasks assessed/performed Overall Cognitive Status: Within Functional Limits for tasks assessed                                        Exercises      General Comments        Pertinent Vitals/Pain Pain Assessment: No/denies pain    Home Living Family/patient expects to be discharged to:: Private residence Living Arrangements: Alone Available Help at Discharge: Family (aunt to come stay with pt a few days) Type of Home: Apartment Home Access: Level entry   Home Layout: One level Home Equipment: None Additional Comments: pt reports her home has narrow doorways("70s style home") and reports she has her home adjusted to her height    Prior Function Level of Independence: Independent  PT Goals (current goals can now be found in the care plan section) Acute Rehab PT Goals PT Goal Formulation: With patient Time For Goal Achievement: 05/04/20 Potential to Achieve Goals: Good Progress towards PT goals: Progressing toward goals    Frequency    Min 5X/week      PT Plan Current plan remains appropriate    Co-evaluation              AM-PAC PT "6 Clicks" Mobility   Outcome Measure  Help needed turning from your back to your side while in a flat bed without using bedrails?: A Little Help needed moving from lying on your back to sitting on the side of a flat bed without using bedrails?: A Little Help needed moving to and from a bed to a chair (including a wheelchair)?: A Little Help needed standing up from  a chair using your arms (e.g., wheelchair or bedside chair)?: A Little Help needed to walk in hospital room?: A Little Help needed climbing 3-5 steps with a railing? : A Lot 6 Click Score: 17    End of Session Equipment Utilized During Treatment: Gait belt Activity Tolerance: Patient tolerated treatment well Patient left: in bed;with call bell/phone within reach Nurse Communication: Mobility status PT Visit Diagnosis: Other abnormalities of gait and mobility (R26.89)     Time: 2353-6144 PT Time Calculation (min) (ACUTE ONLY): 28 min  Charges:  $Gait Training: 8-22 mins $Self Care/Home Management: 8-22                     Paulino Door, DPT Acute Rehabilitation Services Pager: [phone] Office: [phone]  Maida Sale E 04/27/2020, 4:30 PM

## 2020-04-27 NOTE — Discharge Summary (Signed)
Discharge Summary  Patient ID: Vickie Mejia MRN: [id number] DOB/AGE: [birth year] 34 y.o.  Admit date: 04/25/2020 Discharge date: 04/27/2020  Admission Diagnoses: closed right ankle fracture  Discharge Diagnoses:  Active Problems:   Closed right ankle fracture   Closed fracture dislocation of right ankle   Past Medical History:  Diagnosis Date  . Asthma   . Elevated hemoglobin A1c June 2017  . Elevated liver function tests June 2017  . Migraines    without aura    Surgeries: Procedure(s): OPEN REDUCTION INTERNAL FIXATION (ORIF) ANKLE FRACTURE WITH SYNDESYNOTIC REPAIR on 04/26/2020   Consultants (if any): Treatment Team:  Teryl Lucy, MD  Discharged Condition: Improved  Hospital Course: Vickie Mejia is an 34 y.o. female who was admitted 04/25/2020 with a diagnosis of closed right ankle fracture and went to the operating room on 04/26/2020 and underwent the above named procedures.    She was given perioperative antibiotics:  Anti-infectives (From admission, onward)   Start     Dose/Rate Route Frequency Ordered Stop   04/27/20 0000  ceFAZolin (ANCEF) IVPB 2g/100 mL premix        2 g 200 mL/hr over 30 Minutes Intravenous Every 6 hours 04/26/20 2224 04/27/20 1239   04/26/20 0600  ceFAZolin (ANCEF) 3 g in dextrose 5 % 50 mL IVPB        3 g 100 mL/hr over 30 Minutes Intravenous On call to O.R. 04/25/20 2010 04/26/20 1811    .  She was given sequential compression devices and early ambulation for DVT prophylaxis.  She benefited maximally from the hospital stay and there were no complications.    Recent vital signs:  Vitals:   04/27/20 0918 04/27/20 1452  BP: (!) 156/88 (!) 178/74  Pulse: 75 91  Resp: 17 18  Temp: 97.7 F (36.5 C) 98.7 F (37.1 C)  SpO2: 100% 99%    Recent laboratory studies:  Lab Results  Component Value Date   HGB 13.2 04/27/2020   HGB 13.4 04/25/2020   HGB 13.7 11/23/2015   Lab Results  Component Value Date    WBC 10.2 04/27/2020   PLT 354 04/27/2020   No results found for: INR Lab Results  Component Value Date   NA 139 04/27/2020   K 3.7 04/27/2020   CL 105 04/27/2020   CO2 24 04/27/2020   BUN 8 04/27/2020   CREATININE 0.73 04/27/2020   GLUCOSE 142 (H) 04/27/2020    Discharge Medications:   Allergies as of 04/27/2020      Reactions   Sulfa Antibiotics Itching, Other (See Comments)   Tingly feeling in eye, redness    Coconut Oil Itching, Swelling   Swelling of the throat --- very mild reaction -- just by mouth       Medication List    STOP taking these medications   acetaminophen 325 MG tablet Commonly known as: TYLENOL     TAKE these medications   albuterol 108 (90 Base) MCG/ACT inhaler Commonly known as: VENTOLIN HFA Inhale 2 puffs into the lungs every 6 (six) hours as needed for wheezing or shortness of breath.   ASHWAGANDHA PO Take 1 tablet by mouth daily.   baclofen 10 MG tablet Commonly known as: LIORESAL Take 1 tablet (10 mg total) by mouth 3 (three) times daily. As needed for muscle spasm   Cetirizine HCl 10 MG Caps Take 10 capsules by mouth daily.   HYDROcodone-acetaminophen 5-325 MG tablet Commonly known as: Norco Take 1-2 tablets by mouth every 6 (  six) hours as needed for moderate pain. MAXIMUM TOTAL ACETAMINOPHEN DOSE IS 4000 MG PER DAY   NON FORMULARY Take 1 tablet by mouth daily. Super Fruits vitamins daily   norethindrone 0.35 MG tablet Commonly known as: Heather Take 1 tablet (0.35 mg total) by mouth daily.   ondansetron 4 MG tablet Commonly known as: Zofran Take 1 tablet (4 mg total) by mouth every 8 (eight) hours as needed for nausea or vomiting.            Durable Medical Equipment  (From admission, onward)         Start     Ordered   04/27/20 1524  For home use only DME Walker rolling  Once       Question Answer Comment  Walker: With 5 Inch Wheels   Patient needs a walker to treat with the following condition Aftercare following  left ankle joint replacement surgery      04/27/20 1524   04/27/20 1507  For home use only DME Bedside commode  Once       Question:  Patient needs a bedside commode to treat with the following condition  Answer:  Closed right ankle fracture   04/27/20 1507   04/27/20 1507  For home use only DME 3 n 1  Once        04/27/20 1507          Diagnostic Studies: DG Ankle Right Port  Result Date: 04/26/2020 CLINICAL DATA:  Status post ORIF right ankle EXAM: PORTABLE RIGHT ANKLE - 2 VIEW COMPARISON:  04/25/2020 FINDINGS: Fixation sideplate is noted along the distal fibula. Two retention devices are noted traversing the tibia and fibula. Fracture fragments are in near anatomic alignment. No significant subluxation is noted. IMPRESSION: Postsurgical changes without acute abnormality. Electronically Signed   By: Alcide Clever M.D.   On: 04/26/2020 21:43   DG Ankle Right Port  Result Date: 04/25/2020 CLINICAL DATA:  34 year old female with fall and trauma to the right ankle. EXAM: PORTABLE RIGHT ANKLE - 2 VIEW COMPARISON:  None. FINDINGS: There is a mildly displaced and comminuted oblique fracture of the distal fibula with approximately 3 mm medial displacement of the distal fracture fragment. There is a mildly displaced fracture of the tip of the medial malleolus. There is slight lateral subluxation of the ankle mortise with widening of the medial talar tibial space. There is diffuse soft tissue swelling of the ankle. IMPRESSION: Mildly displaced fractures of the distal fibula and tip of the medial malleolus with slight lateral subluxation of the ankle mortise. Electronically Signed   By: Elgie Collard M.D.   On: 04/25/2020 16:33    Disposition: Discharge disposition: 01-Home or Self Care          Follow-up Information    Teryl Lucy, MD. Schedule an appointment as soon as possible for a visit in 2 weeks.   Specialty: Orthopedic Surgery Contact information: [address] [place] [address] [phone]                Signed: Annita Brod 04/27/2020, 3:51 PM

## 2020-04-27 NOTE — TOC Initial Note (Signed)
Transition of Care [facility]) - Initial/Assessment Note    Patient Details  Name: Vickie Mejia MRN: [id number] Date of Birth: 06-Dec-1985  Transition of Care [facility]) CM/SW Contact:    Lia Hopping, [place] Phone Number: 04/27/2020, 4:02 PM  Clinical Narrative:    Re: HHPT/ DME              CSW met with patient at bedside to arrange HHPT and DME (RW, 3 in1). Per PA, the scooter order has been sent to [facility] for the patient to pick up. CSW spoke with patient regarding in network HHPT. CSW reached out to [facility] and confirm staff availability. Patient notified the agency will have to get authorization approval which can take up to 24-48 hours, therefore there may be delay in start of care. Patient report understanding.  RW and 3in 1 ordered through [facility] and will be delivered to the patient bedside. No other needs identified.   Expected Discharge Plan: [place] Barriers to Discharge: Barriers Resolved   Patient Goals and CMS Choice Patient states their goals for this hospitalization and ongoing recovery are:: return home      Expected Discharge Plan and Services Expected Discharge Plan: [place] In-house Referral: Clinical Social Work Discharge Planning Services: CM Consult Post Acute Care Choice: [place] arrangements for the past 2 months: Apartment Expected Discharge Date: 04/27/20               DME Arranged: Berta Minor rolling DME Agency: [employer] Date DME Agency Contacted: 04/27/20 Time DME Agency Contacted: [id number] Representative spoke with at DME Agency: Freda Munro HH Arranged: PT [place]: [place] Date [place]: 04/27/20 Time [place]: 76 Representative spoke with at [place]: [place] Arrangements/Services Living arrangements for the past 2 months: [place] with:: Self Patient language and need for interpreter reviewed:: No Do  you feel safe going back to the place where you live?: Yes      Need for Family Participation in Patient Care: Yes (Comment) Care giver support system in place?: Yes (comment)   Criminal Activity/Legal Involvement Pertinent to Current Situation/Hospitalization: No - Comment as needed  Activities of Daily Living Home Assistive Devices/Equipment: Eyeglasses ADL Screening (condition at time of admission) Patient's cognitive ability adequate to safely complete daily activities?: Yes Is the patient deaf or have difficulty hearing?: No Does the patient have difficulty seeing, even when wearing glasses/contacts?: No Does the patient have difficulty concentrating, remembering, or making decisions?: No Patient able to express need for assistance with ADLs?: Yes Does the patient have difficulty dressing or bathing?: No Independently performs ADLs?: No Communication: Independent Dressing (OT): Independent Grooming: Independent Feeding: Independent Bathing: Needs assistance Is this a change from baseline?: Change from baseline, expected to last >3 days Toileting: Needs assistance Is this a change from baseline?: Change from baseline, expected to last >3days In/Out Bed: Needs assistance Is this a change from baseline?: Change from baseline, expected to last >3 days Walks in Home: Needs assistance Is this a change from baseline?: Change from baseline, expected to last >3 days Does the patient have difficulty walking or climbing stairs?: Yes Weakness of Legs: Right Weakness of Arms/Hands: None  Permission Sought/Granted Permission sought to share information with : Family Supports Permission granted to share information with : Yes, Verbal Permission Granted     Permission granted to share info w AGENCY: [place]  Emotional Assessment Appearance:: Appears stated age Attitude/Demeanor/Rapport: Engaged Affect (typically observed): Accepting Orientation: : Oriented to Self, Oriented  to Place, Oriented to  Time, Oriented to Situation Alcohol / Substance Use: Not Applicable Psych Involvement: No (comment)  Admission diagnosis:  Fall [W19.XXXA] Closed right ankle fracture [S82.891A] Closed displaced spiral fracture of shaft of left fibula, initial encounter [S82.442A] Closed displaced fracture of medial malleolus of left tibia, initial encounter [S82.52XA] Closed fracture dislocation of left ankle [S82.892A] Patient Active Problem List   Diagnosis Date Noted  . Closed fracture dislocation of right ankle 04/26/2020  . Closed right ankle fracture 04/25/2020  . Ankle pain, chronic 01/30/2017  . Obesity, morbid, BMI 50 or higher ([place]) 01/30/2017   PCP:  [place], Betty G, MD Pharmacy:   [facility] - [address] [place] [address] Phone: [phone] Fax: [place], [address] AT [address] [place] [address] Phone: [phone] Fax: [phone]     Social Determinants of Health (SDOH) Interventions    Readmission Risk Interventions No flowsheet data found.

## 2020-04-27 NOTE — Progress Notes (Signed)
Subjective: 1 Day Post-Op s/p Procedure(s): OPEN REDUCTION INTERNAL FIXATION (ORIF) ANKLE FRACTURE WITH SYNDESYNOTIC REPAIR  Doing well, but did not sleep well overnight. No pain this am. Patient denies chest pain, shortness of breath, nausea or vomiting.  Objective:  PE: VITALS:   Vitals:   04/26/20 2337 04/27/20 0042 04/27/20 0130 04/27/20 0551  BP: (!) 149/83 (!) 145/83 (!) 148/77 129/71  Pulse: 85 85 87 75  Resp: 18 18 16 19   Temp: 98.2 F (36.8 C) 98.9 F (37.2 C) 98.2 F (36.8 C) 98.1 F (36.7 C)  TempSrc:   Oral   SpO2: 97% 99% 99% 99%  Weight:      Height:       General: Alert, oriented, sitting up in bed GI: abd soft, nontender MSK: Sensation and motor not intact at toes of right foot. Splint in place with no drainage. Calf compressible and nontender. Straight leg raise intact.    LABS  Results for orders placed or performed during the hospital encounter of 04/25/20 (from the past 24 hour(s))  Basic metabolic panel     Status: Abnormal   Collection Time: 04/27/20  4:00 AM  Result Value Ref Range   Sodium 139 135 - 145 mmol/L   Potassium 3.7 3.5 - 5.1 mmol/L   Chloride 105 98 - 111 mmol/L   CO2 24 22 - 32 mmol/L   Glucose, Bld 142 (H) 70 - 99 mg/dL   BUN 8 6 - 20 mg/dL   Creatinine, Ser 14/01/21 0.44 - 1.00 mg/dL   Calcium 9.1 8.9 - 6.07 mg/dL   GFR, Estimated 37.1 >06 mL/min   Anion gap 10 5 - 15  CBC     Status: None   Collection Time: 04/27/20  4:00 AM  Result Value Ref Range   WBC 10.2 4.0 - 10.5 K/uL   RBC 4.50 3.87 - 5.11 MIL/uL   Hemoglobin 13.2 12.0 - 15.0 g/dL   HCT 14/01/21 36 - 46 %   MCV 90.7 80.0 - 100.0 fL   MCH 29.3 26.0 - 34.0 pg   MCHC 32.4 30.0 - 36.0 g/dL   RDW 94.8 54.6 - 27.0 %   Platelets 354 150 - 400 K/uL   nRBC 0.0 0.0 - 0.2 %    DG Ankle Right Port  Result Date: 04/26/2020 CLINICAL DATA:  Status post ORIF right ankle EXAM: PORTABLE RIGHT ANKLE - 2 VIEW COMPARISON:  04/25/2020 FINDINGS: Fixation sideplate is noted along  the distal fibula. Two retention devices are noted traversing the tibia and fibula. Fracture fragments are in near anatomic alignment. No significant subluxation is noted. IMPRESSION: Postsurgical changes without acute abnormality. Electronically Signed   By: 04/27/2020 M.D.   On: 04/26/2020 21:43   DG Ankle Right Port  Result Date: 04/25/2020 CLINICAL DATA:  34 year old female with fall and trauma to the right ankle. EXAM: PORTABLE RIGHT ANKLE - 2 VIEW COMPARISON:  None. FINDINGS: There is a mildly displaced and comminuted oblique fracture of the distal fibula with approximately 3 mm medial displacement of the distal fracture fragment. There is a mildly displaced fracture of the tip of the medial malleolus. There is slight lateral subluxation of the ankle mortise with widening of the medial talar tibial space. There is diffuse soft tissue swelling of the ankle. IMPRESSION: Mildly displaced fractures of the distal fibula and tip of the medial malleolus with slight lateral subluxation of the ankle mortise. Electronically Signed   By: 31.D.  On: 04/25/2020 16:33    Assessment/Plan: Active Problems:   Closed right ankle fracture   Closed fracture dislocation of right ankle   Close right ankle fracture: 1 Day Post-Op s/p Procedure(s): OPEN REDUCTION INTERNAL FIXATION (ORIF) ANKLE FRACTURE WITH SYNDESYNOTIC REPAIR - overall doing well, block has not yet worn off  Weightbearing: NWB RLE Insicional and dressing care: Dressings left intact until follow-up Orthopedic device(s): None and Splint VTE prophylaxis: SCD's, mobilization  Pain control: continue current regimen Follow - up plan: 2 weeks with Dr. Dion Saucier Dipso: pending PT eval today, spoke with patient regarding going home with HHPT vs an inpatient rehab consult. We will wait on PT recommendations but also patient would like to call her insurance company and work this Am before making any decisions.   Contact information:    Weekdays 8-5 Janine [place], [place] [phone] A fter hours and holidays please check [email or web address] for group call information for [facility]  Armida Sans 04/27/2020, 8:17 AM

## 2020-04-27 NOTE — Discharge Instructions (Signed)
Diet: As you were doing prior to hospitalization   Shower:  May shower but keep the wounds dry, use an occlusive plastic wrap, NO SOAKING IN TUB.  If the bandage gets wet, change with a clean dry gauze.  If you have a splint on, leave the splint in place and keep the splint dry with a plastic bag.  Dressing:  You may change your dressing 3-5 days after surgery, unless you have a splint.  If you have a splint, then just leave the splint in place and we will change your bandages during your first follow-up appointment.    If you had hand or foot surgery, we will plan to remove your stitches in about 2 weeks in the office.  For all other surgeries, there are sticky tapes (steri-strips) on your wounds and all the stitches are absorbable.  Leave the steri-strips in place when changing your dressings, they will peel off with time, usually 2-3 weeks.  Activity:  Increase activity slowly as tolerated, but follow the weight bearing instructions below.  The rules on driving is that you can not be taking narcotics while you drive, and you must feel in control of the vehicle.    Weight Bearing:   No weight bearing on right leg.    To prevent constipation: you may use a stool softener such as -  Colace (over the counter) 100 mg by mouth twice a day  Drink plenty of fluids (prune juice may be helpful) and high fiber foods Miralax (over the counter) for constipation as needed.    Itching:  If you experience itching with your medications, try taking only a single pain pill, or even half a pain pill at a time.  You may take up to 10 pain pills per day, and you can also use benadryl over the counter for itching or also to help with sleep.   Precautions:  If you experience chest pain or shortness of breath - call 911 immediately for transfer to the hospital emergency department!!  If you develop a fever greater that 101 F, purulent drainage from wound, increased redness or drainage from wound, or calf pain --  Call the office at [phone]                                                Follow- Up Appointment:  Please call for an appointment to be seen in 2 weeks [place] - [phone]  Knee walker - Please call [facility] at [phone]. They have received your prescription for your knee scooter and should be able to coordinate with you on pick up.

## 2020-04-27 NOTE — Evaluation (Signed)
Physical Therapy Evaluation Patient Details Name: Vickie Mejia MRN: [id number] DOB: 07-Jul-1985 Today's Date: 04/27/2020   History of Present Illness  Pt is a 34 year old female s/p OPEN REDUCTION INTERNAL FIXATION (ORIF) ANKLE FRACTURE WITH SYNDESYNOTIC REPAIR  Clinical Impression  Pt admitted with above diagnosis.  Pt currently with functional limitations due to the deficits listed below (see PT Problem List). Pt will benefit from skilled PT to increase their independence and safety with mobility to allow discharge to the venue listed below.  Pt assisted with ambulating in hallway and fatigued quickly.  Pt reports RW too wide for the doorways in her home so will return to see if pt is able to use knee scooter safely.  Pt anticipates her aunt being able to stay with her a few days upon d/c.     Follow Up Recommendations Supervision/Assistance - 24 hour    Equipment Recommendations  Other (comment);3in1 (PT) (pending, pt states RW won't fit through door, may need knee scooter)    Recommendations for Other Services       Precautions / Restrictions Precautions Precautions: Fall Restrictions Weight Bearing Restrictions: Yes RLE Weight Bearing: Non weight bearing      Mobility  Bed Mobility Overal bed mobility: Needs Assistance Bed Mobility: Supine to Sit;Sit to Supine     Supine to sit: Supervision;HOB elevated Sit to supine: Supervision   General bed mobility comments: effortful    Transfers Overall transfer level: Needs assistance Equipment used: Rolling walker (2 wheeled) Transfers: Sit to/from Stand Sit to Stand: Min guard;Min assist         General transfer comment: verbal cues for UE and LE positioning, pt aware of NWB status, assist to control descent  Ambulation/Gait Ambulation/Gait assistance: Min guard;+2 safety/equipment Gait Distance (Feet): 50 Feet (x2) Assistive device: Rolling walker (2 wheeled)       General Gait Details: swing  through, cues for RW distance and R LE positioning, required seated rest break  Stairs            Wheelchair Mobility    Modified Rankin (Stroke Patients Only)       Balance                                             Pertinent Vitals/Pain Pain Assessment: No/denies pain    Home Living Family/patient expects to be discharged to:: Private residence Living Arrangements: Alone Available Help at Discharge: Family (aunt to come stay with pt a few days) Type of Home: Apartment Home Access: Level entry     Home Layout: One level Home Equipment: None Additional Comments: pt reports her home has narrow doorways("70s style home") and reports she has her home adjusted to her height    Prior Function Level of Independence: Independent               Hand Dominance        Extremity/Trunk Assessment        Lower Extremity Assessment Lower Extremity Assessment: RLE deficits/detail RLE Deficits / Details: pt able to lift R LE, ankle casted/splinted       Communication   Communication: No difficulties  Cognition Arousal/Alertness: Awake/alert Behavior During Therapy: WFL for tasks assessed/performed Overall Cognitive Status: Within Functional Limits for tasks assessed  General Comments      Exercises     Assessment/Plan    PT Assessment Patient needs continued PT services  PT Problem List Decreased strength;Decreased mobility;Decreased knowledge of use of DME;Decreased balance;Decreased activity tolerance;Decreased knowledge of precautions       PT Treatment Interventions DME instruction;Gait training;Balance training;Therapeutic exercise;Functional mobility training;Therapeutic activities;Patient/family education    PT Goals (Current goals can be found in the Care Plan section)  Acute Rehab PT Goals PT Goal Formulation: With patient Time For Goal Achievement:  05/04/20 Potential to Achieve Goals: Good    Frequency Min 5X/week   Barriers to discharge        Co-evaluation               AM-PAC PT "6 Clicks" Mobility  Outcome Measure Help needed turning from your back to your side while in a flat bed without using bedrails?: A Little Help needed moving from lying on your back to sitting on the side of a flat bed without using bedrails?: A Little Help needed moving to and from a bed to a chair (including a wheelchair)?: A Little Help needed standing up from a chair using your arms (e.g., wheelchair or bedside chair)?: A Little Help needed to walk in hospital room?: A Little Help needed climbing 3-5 steps with a railing? : A Lot 6 Click Score: 17    End of Session Equipment Utilized During Treatment: Gait belt Activity Tolerance: Patient tolerated treatment well Patient left: in bed;with call bell/phone within reach;with bed alarm set Nurse Communication: Mobility status PT Visit Diagnosis: Other abnormalities of gait and mobility (R26.89)    Time: 1102-1117 PT Time Calculation (min) (ACUTE ONLY): 26 min   Charges:   PT Evaluation $PT Eval Low Complexity: 1 Low PT Treatments $Gait Training: 8-22 mins      Paulino Door, DPT Acute Rehabilitation Services Pager: [phone] Office: [phone]  Maida Sale E 04/27/2020, 1:22 PM

## 2020-04-27 NOTE — Plan of Care (Signed)
  Problem: Education: Goal: Knowledge of General Education information will improve Description: Including pain rating scale, medication(s)/side effects and non-pharmacologic comfort measures Outcome: Progressing   Problem: Safety: Goal: Ability to remain free from injury will improve Outcome: Progressing   

## 2020-04-27 NOTE — Plan of Care (Signed)
Patient dc'd, all careplans completed °

## 2020-04-27 NOTE — Progress Notes (Signed)
Handbook given to patient.  

## 2020-04-28 ENCOUNTER — Encounter (HOSPITAL_COMMUNITY): Payer: Self-pay | Admitting: Orthopedic Surgery

## 2021-01-25 ENCOUNTER — Other Ambulatory Visit: Payer: Self-pay | Admitting: Obstetrics and Gynecology

## 2021-01-25 NOTE — Telephone Encounter (Signed)
Patient scheduled on 03/29/21 for annual exam.

## 2021-03-12 DIAGNOSIS — R000 Tachycardia, unspecified: Secondary | ICD-10-CM | POA: Insufficient documentation

## 2021-03-28 NOTE — Progress Notes (Signed)
35 y.o. G0P0000 Single African American female here for annual exam.    Having yeast infection in her skin folds. Nystatin powder is not helping.   Has PCOS.  Some facial hair.  Not shaving.   Menses every 2 months.  Last about 5 - 6 days.  No bleeding in between menses.  Taking progesterone only birth control pills.   Declines STD screening.   Broke her ankle.  Had surgery.   PCP:   Betty [place]az[place], MD  Patient's last menstrual period was 02/16/2021 (exact date).     Period Cycle (Days): 45 Period Pattern: Regular Menstrual Flow:  (heavy first day then tapers) Menstrual Control: Maxi pad Menstrual Control Change Freq (Hours): changes maxi pad every 3-4 hours on heavy day Dysmenorrhea: (!) Mild Dysmenorrhea Symptoms: Cramping (occ headache prior to cycle)     Sexually active: No.  The current method of family planning is oral progesterone-only contraceptive.    Exercising: No.   Some walking--broken ankle and just completed PT Smoker:  no  Health Maintenance: Pap: 12/19/17 Neg:Neg HR HPV, 10-20-14 Neg  History of abnormal Pap:  no MMG:  n/a Colonoscopy:  n/a BMD:   n/a  Result  n/a TDaP: 12-05-16 Gardasil:   no HIV: 04-25-20 NR Hep C: no Screening Labs:  PCP.   Flu vaccine:  today. Covid vaccine:  completed Moderna series.    reports that she has never smoked. She has never used smokeless tobacco. She reports that she does not currently use alcohol. She reports that she does not use drugs.  Past Medical History:  Diagnosis Date   Asthma    Elevated hemoglobin A1c June 2017   Elevated liver function tests June 2017   Migraines    without aura    Past Surgical History:  Procedure Laterality Date   ORIF ANKLE FRACTURE Right 04/26/2020   Procedure: OPEN REDUCTION INTERNAL FIXATION (ORIF) ANKLE FRACTURE WITH SYNDESYNOTIC REPAIR;  Surgeon: Teryl Lucy, MD;  Location: [facility];  Service: Orthopedics;  Laterality: Right;   WISDOM TOOTH EXTRACTION      Current  Outpatient Medications  Medication Sig Dispense Refill   Iron-Vitamins (GERITOL COMPLETE PO) Take 1 tablet by mouth daily.     albuterol (PROVENTIL HFA;VENTOLIN HFA) 108 (90 Base) MCG/ACT inhaler Inhale 2 puffs into the lungs every 6 (six) hours as needed for wheezing or shortness of breath. 1 Inhaler 0   Cetirizine HCl 10 MG CAPS Take 10 capsules by mouth daily.      norethindrone (HEATHER) 0.35 MG tablet TAKE 1 TABLET DAILY 84 tablet 0   No current facility-administered medications for this visit.    Family History  Problem Relation Age of Onset   Pancreatic cancer Mother    Diabetes Mother    Cancer Mother        pancreatic   Hypertension Paternal Grandmother    COPD Paternal Grandmother    Stroke Paternal Grandfather    Epilepsy Brother     Review of Systems  All other systems reviewed and are negative.  Exam:   BP (!) 144/88   Pulse (!) 105   Ht 6\' 2"  (1.88 m)   Wt (!) 425 lb (192.8 kg)   LMP 02/16/2021 (Exact Date)   SpO2 98%   BMI 54.57 kg/m     General appearance: alert, cooperative and appears stated age Head: normocephalic, without obvious abnormality, atraumatic Neck: no adenopathy, supple, symmetrical, trachea midline and thyroid normal to inspection and palpation Lungs: clear to auscultation bilaterally  Breasts: normal appearance, no masses or tenderness, No nipple retraction or dimpling, No nipple discharge or bleeding, No axillary adenopathy Heart: regular rate and rhythm Abdomen: pannus.  soft, non-tender; no masses, no organomegaly Extremities: extremities normal, atraumatic, no cyanosis or edema Skin: skin color, texture, turgor normal. Patch of darker colored skin in midline of panus - 10 x 4 cm. Lymph nodes: cervical, supraclavicular, and axillary nodes normal. Neurologic: grossly normal  Pelvic: External genitalia:  no lesions              No abnormal inguinal nodes palpated.              Urethra:  normal appearing urethra with no masses,  tenderness or lesions              Bartholins and Skenes: normal                 Vagina: normal appearing vagina with normal color and discharge, no lesions              Cervix: no lesions.  Tiny brown clot at os.               Pap taken: no. Bimanual Exam:  Uterus:  normal size, contour, position, consistency, mobility, non-tender              Adnexa: no mass, fullness, tenderness            Chaperone was present for exam:  Joy, CMA.  Assessment:   Well woman visit with gynecologic exam. On Micronor for cycle control and probable anovulatory cycles. Migraine without aura. Elevated hemoglobin A1C. Candida of flexural folds. Asthma.  BMI 54.57.  Plan: Mammogram screening discussed. Self breast awareness reviewed. Pap and HR HPV as above. Guidelines for Calcium, Vitamin D, regular exercise program including cardiovascular and weight bearing exercise. Continue with Micronor.  Refill for one year.  We discussed low sugar and low carb diet.  She will consider Medical [facility] Management through [facility].  Rx for Clotrimazole cream.  Flu vaccine.  Follow up annually and prn.   After visit summary provided.

## 2021-03-29 ENCOUNTER — Other Ambulatory Visit: Payer: Self-pay

## 2021-03-29 ENCOUNTER — Encounter: Payer: Self-pay | Admitting: Obstetrics and Gynecology

## 2021-03-29 ENCOUNTER — Ambulatory Visit (INDEPENDENT_AMBULATORY_CARE_PROVIDER_SITE_OTHER): Payer: Managed Care, Other (non HMO) | Admitting: Obstetrics and Gynecology

## 2021-03-29 VITALS — BP 144/88 | HR 105 | Ht 74.0 in | Wt >= 6400 oz

## 2021-03-29 DIAGNOSIS — B372 Candidiasis of skin and nail: Secondary | ICD-10-CM | POA: Diagnosis not present

## 2021-03-29 DIAGNOSIS — Z01419 Encounter for gynecological examination (general) (routine) without abnormal findings: Secondary | ICD-10-CM | POA: Diagnosis not present

## 2021-03-29 DIAGNOSIS — Z23 Encounter for immunization: Secondary | ICD-10-CM | POA: Diagnosis not present

## 2021-03-29 MED ORDER — CLOTRIMAZOLE 1 % EX CREA: 1.0000 "application " | TOPICAL_CREAM | Freq: Two times a day (BID) | CUTANEOUS | 1 refills | Status: AC

## 2021-03-29 MED ORDER — NORETHINDRONE 0.35 MG PO TABS
1.0000 | ORAL_TABLET | Freq: Every day | ORAL | 3 refills | Status: DC
Start: 2021-03-29 — End: 2022-04-04

## 2021-03-29 NOTE — Patient Instructions (Signed)

## 2021-03-31 ENCOUNTER — Encounter: Payer: Self-pay | Admitting: Obstetrics and Gynecology

## 2021-05-01 NOTE — Progress Notes (Signed)
HPI: Vickie Mejia is a 35 y.o. female, who is here today for her routine physical and palpitations.  Last CPE: 02/03/19.  Regular exercise 3 or more time per week: She has not been consistent , waiting for the gym close to her apartment done. Following a healthy diet: She is doing better,controlling portions,less sweets. She lives with her 74 yo brother.  Chronic medical problems: Obesity,prediabetes,and chronic ankle pain. S/P ORIF of right ankle, 04/26/20. Achy pain and edema have improved, still mild limitation of ROM.  Immunization History  Administered Date(s) Administered   Influenza,inj,Quad PF,6+ Mos 02/03/2019, 03/29/2021   Influenza-Unspecified 03/29/2015, 02/07/2017   Moderna Sars-Covid-2 Vaccination 08/28/2019, 09/25/2019   Tdap 12/05/2016   Tetanus 05/28/2006   Health Maintenance  Topic Date Due   Hepatitis C Screening  Never done   COVID-19 Vaccine (3 - Booster for Moderna series) 05/18/2021 (Originally 11/20/2019)   PAP SMEAR-Modifier  12/20/2022   TETANUS/TDAP  12/06/2026   INFLUENZA VACCINE  Completed   HIV Screening  Completed   Pneumococcal Vaccine 19-16 Years old  Aged Out   HPV VACCINES  Aged Out   Follows with gyn, Dr Judeth Horn, last seen in 03/29/21.  Lab Results  Component Value Date   CREATININE 0.73 04/27/2020   BUN 8 04/27/2020   NA 139 04/27/2020   K 3.7 04/27/2020   CL 105 04/27/2020   CO2 24 04/27/2020   Lab Results  Component Value Date   CHOL 184 02/03/2019   HDL 43.60 02/03/2019   LDLCALC 123 (H) 02/03/2019   TRIG 91.0 02/03/2019   CHOLHDL 4 02/03/2019   "Random" "heart racing" while in bed. It has happened a couple times in the past month, last episode about 2 weeks ago. She has not identified exacerbating or alleviating factors. No associated symptoms.  Occasionally she feels like "something moving in her veins", distal RLE. No associated edema, pain,or erythema. Sometimes " little" tingling.  Review of Systems   Constitutional:  Negative for appetite change and fever.  HENT:  Negative for hearing loss, mouth sores, sore throat, trouble swallowing and voice change.   Eyes:  Negative for redness and visual disturbance.  Respiratory:  Negative for cough, shortness of breath and wheezing.   Cardiovascular:  Positive for palpitations. Negative for chest pain and leg swelling.  Gastrointestinal:  Negative for abdominal pain, nausea and vomiting.       No changes in bowel habits.  Endocrine: Negative for cold intolerance, heat intolerance, polydipsia, polyphagia and polyuria.  Genitourinary:  Negative for decreased urine volume, dysuria, hematuria, vaginal bleeding and vaginal discharge.  Musculoskeletal:  Positive for arthralgias. Negative for gait problem and myalgias.  Skin:  Negative for color change and rash.  Allergic/Immunologic: Positive for environmental allergies.  Neurological:  Negative for syncope, weakness and headaches.  Hematological:  Negative for adenopathy. Does not bruise/bleed easily.  Psychiatric/Behavioral:  Negative for confusion. The patient is not nervous/anxious.   All other systems reviewed and are negative.  Current Outpatient Medications on File Prior to Visit  Medication Sig Dispense Refill   albuterol (PROVENTIL HFA;VENTOLIN HFA) 108 (90 Base) MCG/ACT inhaler Inhale 2 puffs into the lungs every 6 (six) hours as needed for wheezing or shortness of breath. 1 Inhaler 0   Cetirizine HCl 10 MG CAPS Take 10 capsules by mouth daily.      clotrimazole (LOTRIMIN) 1 % cream Apply 1 application topically 2 (two) times daily. Use as needed. 30 g 1   Iron-Vitamins (GERITOL COMPLETE PO) Take  1 tablet by mouth daily.     norethindrone (HEATHER) 0.35 MG tablet Take 1 tablet (0.35 mg total) by mouth daily. 84 tablet 3   No current facility-administered medications on file prior to visit.   Past Medical History:  Diagnosis Date   Asthma    Elevated hemoglobin A1c June 2017   Elevated  liver function tests June 2017   Migraines    without aura    Past Surgical History:  Procedure Laterality Date   ORIF ANKLE FRACTURE Right 04/26/2020   Procedure: OPEN REDUCTION INTERNAL FIXATION (ORIF) ANKLE FRACTURE WITH SYNDESYNOTIC REPAIR;  Surgeon: Marchia Bond, MD;  Location: [facility];  Service: Orthopedics;  Laterality: Right;   WISDOM TOOTH EXTRACTION      Allergies  Allergen Reactions   Sulfa Antibiotics Itching and Other (See Comments)    Tingly feeling in eye, redness    Coconut Oil Itching and Swelling    Swelling of the throat --- very mild reaction -- just by mouth     Family History  Problem Relation Age of Onset   Pancreatic cancer Mother    Diabetes Mother    Cancer Mother        pancreatic   Hypertension Paternal Grandmother    COPD Paternal Grandmother    Stroke Paternal Grandfather    Epilepsy Brother     Social History   Socioeconomic History   Marital status: Single    Spouse name: Not on file   Number of children: Not on file   Years of education: Not on file   Highest education level: Not on file  Occupational History   Not on file  Tobacco Use   Smoking status: Never   Smokeless tobacco: Never  Vaping Use   Vaping Use: Never used  Substance and Sexual Activity   Alcohol use: Not Currently    Comment: 1 drink 2-3x/year   Drug use: No   Sexual activity: Never    Partners: Male    Birth control/protection: Abstinence, Pill    Comment: Heather  Other Topics Concern   Not on file  Social History Narrative   Not on file   Social Determinants of Health   Financial Resource Strain: Not on file  Food Insecurity: Not on file  Transportation Needs: Not on file  Physical Activity: Not on file  Stress: Not on file  Social Connections: Not on file   Vitals:   05/02/21 0855  BP: 128/80  Pulse: 96  Resp: 16  Temp: 98.9 F (37.2 C)  SpO2: 98%   Body mass index is 53.97 kg/m.  Wt Readings from Last 3 Encounters:  05/02/21 (!)  420 lb 6 oz (190.7 kg)  03/29/21 (!) 425 lb (192.8 kg)  04/25/20 (!) 450 lb (204.1 kg)   Physical Exam Vitals and nursing note reviewed.  Constitutional:      General: She is not in acute distress.    Appearance: She is well-developed.  HENT:     Head: Normocephalic and atraumatic.     Right Ear: Hearing, tympanic membrane, ear canal and external ear normal.     Left Ear: Hearing, tympanic membrane, ear canal and external ear normal.     Mouth/Throat:     Mouth: Mucous membranes are moist.     Pharynx: Oropharynx is clear. Uvula midline.  Eyes:     Extraocular Movements: Extraocular movements intact.     Conjunctiva/sclera: Conjunctivae normal.     Pupils: Pupils are equal, round, and reactive to  light.  Neck:     Thyroid: No thyromegaly.     Trachea: No tracheal deviation.  Cardiovascular:     Rate and Rhythm: Normal rate and regular rhythm.     Pulses:          Dorsalis pedis pulses are 2+ on the right side and 2+ on the left side.     Heart sounds: No murmur heard. Pulmonary:     Effort: Pulmonary effort is normal. No respiratory distress.     Breath sounds: Normal breath sounds.  Abdominal:     Palpations: Abdomen is soft. There is no hepatomegaly or mass.     Tenderness: There is no abdominal tenderness.  Genitourinary:    Comments: Deferred to gyn. Musculoskeletal:     Comments: No major deformity or signs of synovitis appreciated.  Lymphadenopathy:     Cervical: No cervical adenopathy.     Upper Body:     Right upper body: No supraclavicular adenopathy.     Left upper body: No supraclavicular adenopathy.  Skin:    General: Skin is warm.     Findings: No erythema or rash.  Neurological:     General: No focal deficit present.     Mental Status: She is alert and oriented to person, place, and time.     Cranial Nerves: No cranial nerve deficit.     Coordination: Coordination normal.     Gait: Gait normal.     Deep Tendon Reflexes:     Reflex Scores:      Bicep  reflexes are 2+ on the right side and 2+ on the left side.      Patellar reflexes are 2+ on the right side and 2+ on the left side. Psychiatric:        Speech: Speech normal.     Comments: Well groomed, good eye contact.  ASSESSMENT AND PLAN:  Ms. Nayleah Bartkiewicz was here today annual physical examination.  Orders Placed This Encounter  Procedures   Hemoglobin A1c   Lipid panel   Hepatitis C antibody screen   TSH   Comprehensive metabolic panel   EKG [id number]   Lab Results  Component Value Date   HGBA1C 5.9 05/02/2021   Lab Results  Component Value Date   CREATININE 0.71 05/02/2021   BUN 10 05/02/2021   NA 139 05/02/2021   K 3.6 05/02/2021   CL 105 05/02/2021   CO2 25 05/02/2021   Lab Results  Component Value Date   ALT 25 05/02/2021   AST 21 05/02/2021   ALKPHOS 56 05/02/2021   BILITOT 0.6 05/02/2021   Lab Results  Component Value Date   TSH 1.94 05/02/2021   Routine general medical examination at a health care facility We discussed the importance of regular physical activity and healthy diet for prevention of chronic illness and/or complications. Preventive guidelines reviewed. Vaccination up-to-date. Continue her female preventive care with her gynecologist. Next CPE in a year.  Encounter for HCV screening test for low risk patient -     Hepatitis C antibody screen  Screening for lipoid disorders -     Lipid panel  Screening for endocrine, metabolic and immunity disorder -     Comprehensive metabolic panel -     Hemoglobin A1c  Palpitation Hx nd examination do not suggest a serious process. EKG today SR,normal axis and intervals. No significant differences when compared with EKG done in 07/2015. I do not think further cardiac work up is needed at this time.  Instructed about warning signs.  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 We discussed benefits of wt loss as well as adverse effects of  obesity. Consistency with healthy diet and physical activity encouraged.  Return in 1 year (on 05/02/2022) for CPE, before depending of lab results..  Cambren Helm G. [place], MD  [facility]. [place] office.

## 2021-05-02 ENCOUNTER — Ambulatory Visit (INDEPENDENT_AMBULATORY_CARE_PROVIDER_SITE_OTHER): Payer: Managed Care, Other (non HMO) | Admitting: Family Medicine

## 2021-05-02 ENCOUNTER — Encounter: Payer: Self-pay | Admitting: Family Medicine

## 2021-05-02 VITALS — BP 128/80 | HR 96 | Temp 98.9°F | Resp 16 | Ht 74.0 in | Wt >= 6400 oz

## 2021-05-02 DIAGNOSIS — Z13228 Encounter for screening for other metabolic disorders: Secondary | ICD-10-CM

## 2021-05-02 DIAGNOSIS — Z130 Encounter for screening for diseases of the blood and blood-forming organs and certain disorders involving the immune mechanism: Secondary | ICD-10-CM | POA: Diagnosis not present

## 2021-05-02 DIAGNOSIS — Z0000 Encounter for general adult medical examination without abnormal findings: Secondary | ICD-10-CM | POA: Diagnosis not present

## 2021-05-02 DIAGNOSIS — Z13220 Encounter for screening for lipoid disorders: Secondary | ICD-10-CM | POA: Diagnosis not present

## 2021-05-02 DIAGNOSIS — R002 Palpitations: Secondary | ICD-10-CM | POA: Diagnosis not present

## 2021-05-02 DIAGNOSIS — Z1329 Encounter for screening for other suspected endocrine disorder: Secondary | ICD-10-CM

## 2021-05-02 DIAGNOSIS — Z1159 Encounter for screening for other viral diseases: Secondary | ICD-10-CM

## 2021-05-02 LAB — LIPID PANEL
Cholesterol: 194 mg/dL (ref 0–200)
HDL: 46.3 mg/dL (ref >39.00)
LDL Cholesterol: 131 mg/dL — ABNORMAL HIGH (ref 0–99)
NonHDL: 147.8
Total CHOL/HDL Ratio: 4
Triglycerides: 83 mg/dL (ref 0.0–149.0)
VLDL: 16.6 mg/dL (ref 0.0–40.0)

## 2021-05-02 LAB — COMPREHENSIVE METABOLIC PANEL
ALT: 25 U/L (ref 0–35)
AST: 21 U/L (ref 0–37)
Albumin: 4.3 g/dL (ref 3.5–5.2)
Alkaline Phosphatase: 56 U/L (ref 39–117)
BUN: 10 mg/dL (ref 6–23)
CO2: 25 mEq/L (ref 19–32)
Calcium: 9.6 mg/dL (ref 8.4–10.5)
Chloride: 105 mEq/L (ref 96–112)
Creatinine, Ser: 0.71 mg/dL (ref 0.40–1.20)
GFR: 109.58 mL/min (ref >60.00)
Glucose, Bld: 78 mg/dL (ref 70–99)
Potassium: 3.6 mEq/L (ref 3.5–5.1)
Sodium: 139 mEq/L (ref 135–145)
Total Bilirubin: 0.6 mg/dL (ref 0.2–1.2)
Total Protein: 8 g/dL (ref 6.0–8.3)

## 2021-05-02 LAB — TSH: TSH: 1.94 u[IU]/mL (ref 0.35–5.50)

## 2021-05-02 LAB — HEMOGLOBIN A1C: Hgb A1c MFr Bld: 5.9 % (ref 4.6–6.5)

## 2021-05-02 NOTE — Patient Instructions (Addendum)
A few things to remember from today's visit:  Routine general medical examination at a health care facility  Encounter for HCV screening test for low risk patient - Plan: Hepatitis C antibody screen  Screening for lipoid disorders - Plan: Lipid panel  Screening for endocrine, metabolic and immunity disorder - Plan: Hemoglobin A1c, Comprehensive metabolic panel, CANCELED: Basic metabolic panel  Palpitation - Plan: TSH, EKG 12-Lead  Do not use My Chart to request refills or for acute issues that need immediate attention.   Please be sure medication list is accurate. If a new problem present, please set up appointment sooner than planned today. Health Maintenance, Female Adopting a healthy lifestyle and getting preventive care are important in promoting health and wellness. Ask your health care provider about: The right schedule for you to have regular tests and exams. Things you can do on your own to prevent diseases and keep yourself healthy. What should I know about diet, weight, and exercise? Eat a healthy diet  Eat a diet that includes plenty of vegetables, fruits, low-fat dairy products, and lean protein. Do not eat a lot of foods that are high in solid fats, added sugars, or sodium. Maintain a healthy weight Body mass index (BMI) is used to identify weight problems. It estimates body fat based on height and weight. Your health care provider can help determine your BMI and help you achieve or maintain a healthy weight. Get regular exercise Get regular exercise. This is one of the most important things you can do for your health. Most adults should: Exercise for at least 150 minutes each week. The exercise should increase your heart rate and make you sweat (moderate-intensity exercise). Do strengthening exercises at least twice a week. This is in addition to the moderate-intensity exercise. Spend less time sitting. Even light physical activity can be beneficial. Watch cholesterol  and blood lipids Have your blood tested for lipids and cholesterol at 36 years of age, then have this test every 5 years. Have your cholesterol levels checked more often if: Your lipid or cholesterol levels are high. You are older than 36 years of age. You are at high risk for heart disease. What should I know about cancer screening? Depending on your health history and family history, you may need to have cancer screening at various ages. This may include screening for: Breast cancer. Cervical cancer. Colorectal cancer. Skin cancer. Lung cancer. What should I know about heart disease, diabetes, and high blood pressure? Blood pressure and heart disease High blood pressure causes heart disease and increases the risk of stroke. This is more likely to develop in people who have high blood pressure readings or are overweight. Have your blood pressure checked: Every 3-5 years if you are 75-39 years of age. Every year if you are 60 years old or older. Diabetes Have regular diabetes screenings. This checks your fasting blood sugar level. Have the screening done: Once every three years after age 24 if you are at a normal weight and have a low risk for diabetes. More often and at a younger age if you are overweight or have a high risk for diabetes. What should I know about preventing infection? Hepatitis B If you have a higher risk for hepatitis B, you should be screened for this virus. Talk with your health care provider to find out if you are at risk for hepatitis B infection. Hepatitis C Testing is recommended for: Everyone born from [birth year] through 1966. Anyone with known risk factors for hepatitis  C. Sexually transmitted infections (STIs) Get screened for STIs, including gonorrhea and chlamydia, if: You are sexually active and are younger than 36 years of age. You are older than 36 years of age and your health care provider tells you that you are at risk for this type of infection. Your  sexual activity has changed since you were last screened, and you are at increased risk for chlamydia or gonorrhea. Ask your health care provider if you are at risk. Ask your health care provider about whether you are at high risk for HIV. Your health care provider may recommend a prescription medicine to help prevent HIV infection. If you choose to take medicine to prevent HIV, you should first get tested for HIV. You should then be tested every 3 months for as long as you are taking the medicine. Pregnancy If you are about to stop having your period (premenopausal) and you may become pregnant, seek counseling before you get pregnant. Take 400 to 800 micrograms (mcg) of folic acid every day if you become pregnant. Ask for birth control (contraception) if you want to prevent pregnancy. Osteoporosis and menopause Osteoporosis is a disease in which the bones lose minerals and strength with aging. This can result in bone fractures. If you are 25 years old or older, or if you are at risk for osteoporosis and fractures, ask your health care provider if you should: Be screened for bone loss. Take a calcium or vitamin D supplement to lower your risk of fractures. Be given hormone replacement therapy (HRT) to treat symptoms of menopause. Follow these instructions at home: Alcohol use Do not drink alcohol if: Your health care provider tells you not to drink. You are pregnant, may be pregnant, or are planning to become pregnant. If you drink alcohol: Limit how much you have to: 0-1 drink a day. Know how much alcohol is in your drink. In the [place] one drink equals one 12 oz bottle of beer (355 mL), one 5 oz glass of wine (148 mL), or one 1 oz glass of hard liquor (44 mL). Lifestyle Do not use any products that contain nicotine or tobacco. These products include cigarettes, chewing tobacco, and vaping devices, such as e-cigarettes. If you need help quitting, ask your health care provider. Do not use  street drugs. Do not share needles. Ask your health care provider for help if you need support or information about quitting drugs. General instructions Schedule regular health, dental, and eye exams. Stay current with your vaccines. Tell your health care provider if: You often feel depressed. You have ever been abused or do not feel safe at home. Summary Adopting a healthy lifestyle and getting preventive care are important in promoting health and wellness. Follow your health care provider's instructions about healthy diet, exercising, and getting tested or screened for diseases. Follow your health care provider's instructions on monitoring your cholesterol and blood pressure. This information is not intended to replace advice given to you by your health care provider. Make sure you discuss any questions you have with your health care provider. Document Revised: 10/03/2020 Document Reviewed: 10/03/2020 Elsevier Patient Education  2022 [employer].

## 2021-05-03 LAB — HEPATITIS C ANTIBODY
Hepatitis C Ab: NONREACTIVE
SIGNAL TO CUT-OFF: 0.09 (ref <1.00)

## 2021-07-15 IMAGING — DX DG ANKLE PORT 2V*R*
3 series · 3 of 3 positions shown · non-contrast
Comparison: None.

CLINICAL DATA: 34-year-old female with fall and trauma to the right
ankle.

EXAM:
PORTABLE RIGHT ANKLE - 2 VIEW

[ankle ap]
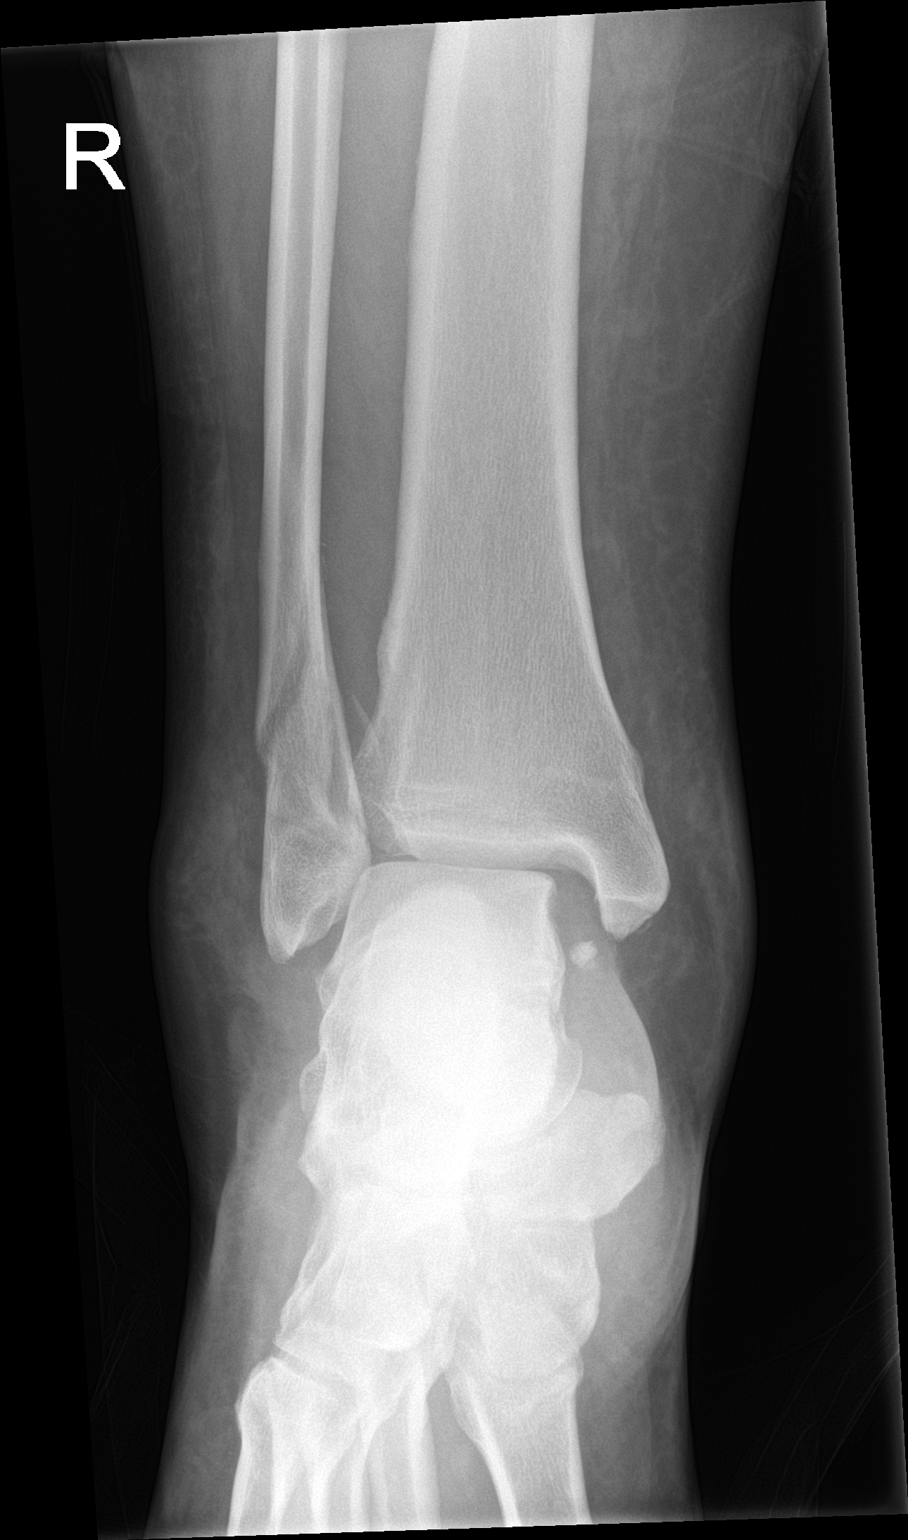

[ankle obl]
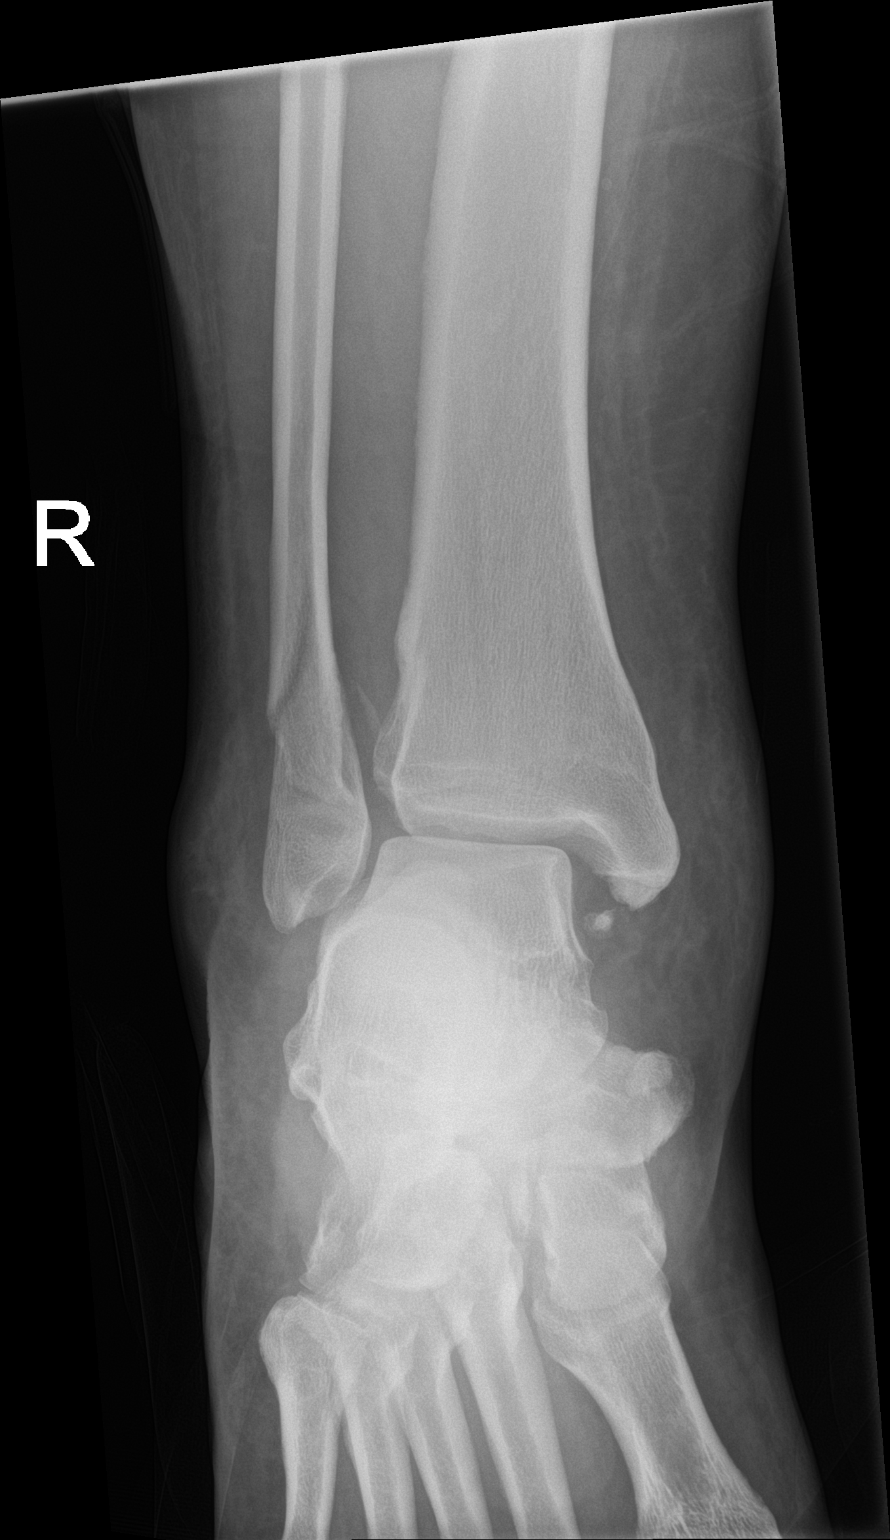

[ankle lat]
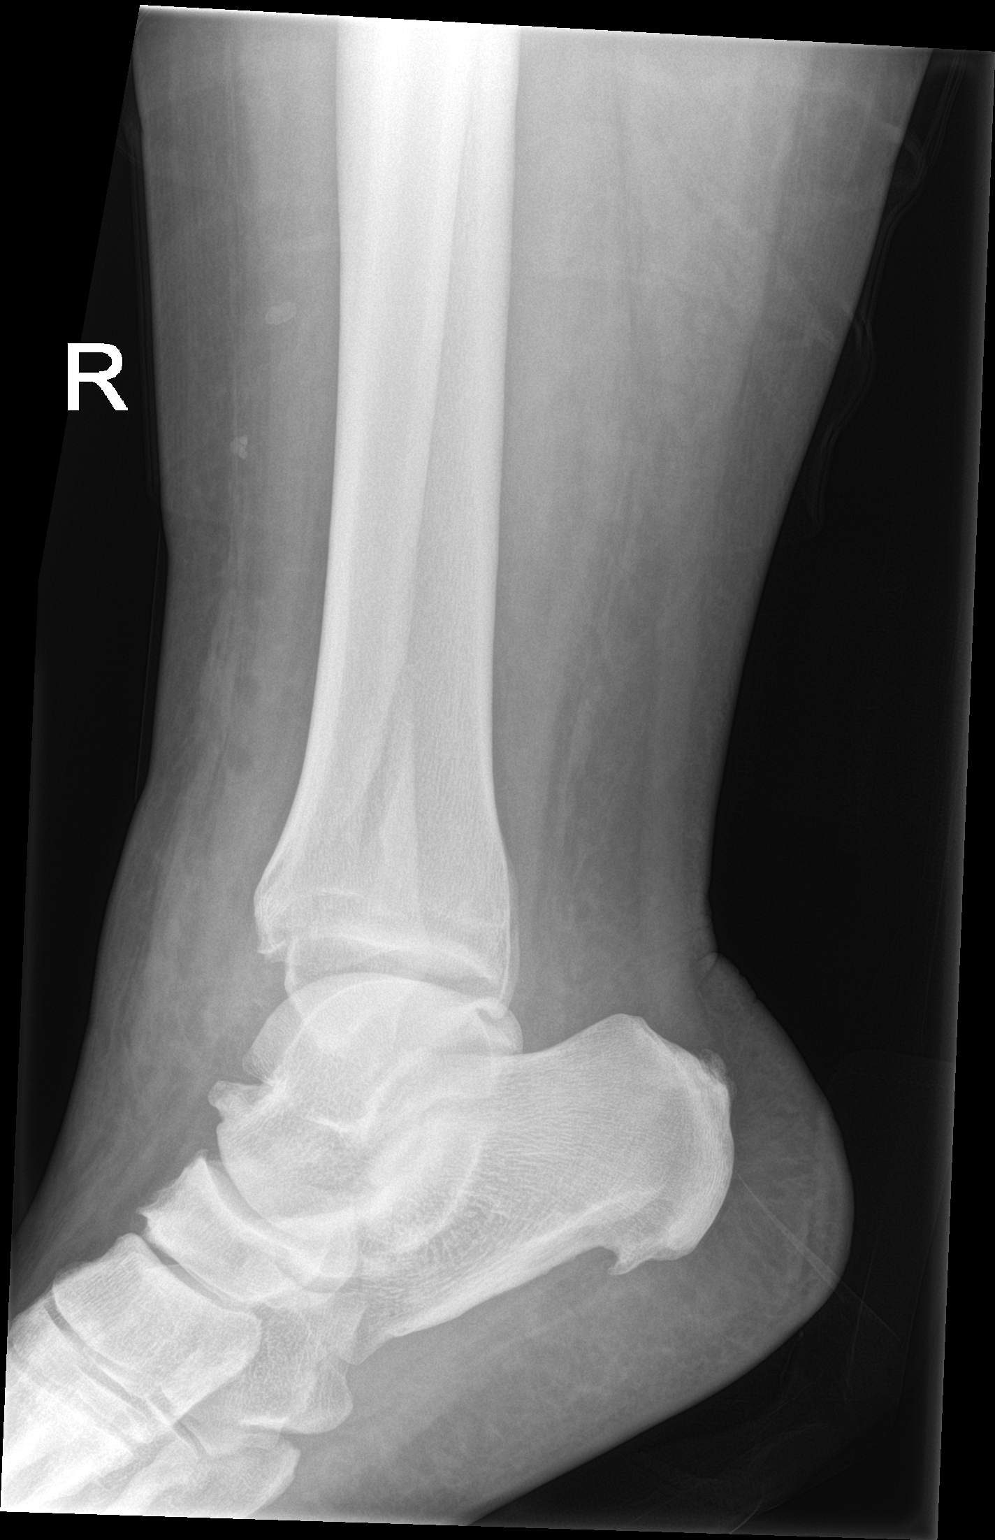

[3 of 3 positions shown; findings below may reference images not displayed]

FINDINGS: There is a mildly displaced and comminuted oblique fracture of the
distal fibula with approximately 3 mm medial displacement of the
distal fracture fragment. There is a mildly displaced fracture of
the tip of the medial malleolus. There is slight lateral subluxation
of the ankle mortise with widening of the medial talar tibial space.
There is diffuse soft tissue swelling of the ankle.
IMPRESSION: Mildly displaced fractures of the distal fibula and tip of the
medial malleolus with slight lateral subluxation of the ankle
mortise.

## 2022-02-28 ENCOUNTER — Other Ambulatory Visit: Payer: Self-pay | Admitting: Obstetrics and Gynecology

## 2022-04-04 ENCOUNTER — Encounter: Payer: Self-pay | Admitting: Obstetrics and Gynecology

## 2022-04-04 ENCOUNTER — Ambulatory Visit (INDEPENDENT_AMBULATORY_CARE_PROVIDER_SITE_OTHER): Payer: Managed Care, Other (non HMO) | Admitting: Obstetrics and Gynecology

## 2022-04-04 VITALS — BP 126/80 | HR 100 | Ht 74.0 in | Wt >= 6400 oz

## 2022-04-04 DIAGNOSIS — L680 Hirsutism: Secondary | ICD-10-CM | POA: Diagnosis not present

## 2022-04-04 DIAGNOSIS — Z01419 Encounter for gynecological examination (general) (routine) without abnormal findings: Secondary | ICD-10-CM | POA: Diagnosis not present

## 2022-04-04 MED ORDER — NORETHINDRONE 0.35 MG PO TABS: 1.0000 | ORAL_TABLET | Freq: Every day | ORAL | 3 refills | Status: DC

## 2022-04-04 NOTE — Patient Instructions (Signed)

## 2022-04-04 NOTE — Progress Notes (Unsigned)
36 y.o. G0P0000 Single African American female here for annual exam.    On Micronor.  Uses it fairly consistently, but can forget.  Has had a menstrual cycle 6 out of 10 months so far this year. Last year had menstrual cycle 6 out of 12 months. Cycles manageable on her Micronor.  Some facial hair growth.   PCP:   Betty [place], MD  Patient's last menstrual period was 03/21/2022.     Period Cycle (Days): 28 Period Duration (Days): 5 Period Pattern: Regular Menstrual Flow: Moderate Menstrual Control: Maxi pad Dysmenorrhea: (!) Mild Dysmenorrhea Symptoms: Cramping     Sexually active: No.  The current method of family planning is oral pils Exercising: Yes.    Home exercise routine includes walking 1 hrs per week. Smoker:  no  Health Maintenance: Pap:   12/19/17 Neg:Neg HR HPV, 10-20-14 Neg  History of abnormal Pap:  no MMG:  n/a Colonoscopy:  n/a BMD:  n/a TDaP:  2018 Gardasil:   no HIV:  04/25/20 - NR Hep C:  05/02/21 - neg Screening Labs:  PCP Flu vaccine and Covid vaccine discussed.   reports that she has never smoked. She has never used smokeless tobacco. She reports that she does not currently use alcohol. She reports that she does not use drugs.  Past Medical History:  Diagnosis Date   Asthma    Elevated hemoglobin A1c June 2017   Elevated liver function tests June 2017   Migraines    without aura    Past Surgical History:  Procedure Laterality Date   ORIF ANKLE FRACTURE Right 04/26/2020   Procedure: OPEN REDUCTION INTERNAL FIXATION (ORIF) ANKLE FRACTURE WITH SYNDESYNOTIC REPAIR;  Surgeon: Teryl Lucy, MD;  Location: [facility];  Service: Orthopedics;  Laterality: Right;   WISDOM TOOTH EXTRACTION      Current Outpatient Medications  Medication Sig Dispense Refill   albuterol (PROVENTIL HFA;VENTOLIN HFA) 108 (90 Base) MCG/ACT inhaler Inhale 2 puffs into the lungs every 6 (six) hours as needed for wheezing or shortness of breath. 1 Inhaler 0   clotrimazole  (LOTRIMIN) 1 % cream Apply 1 application topically 2 (two) times daily. Use as needed. 30 g 1   Iron-Vitamins (GERITOL COMPLETE PO) Take 1 tablet by mouth daily.     norethindrone (HEATHER) 0.35 MG tablet Take 1 tablet (0.35 mg total) by mouth daily. 84 tablet 3   No current facility-administered medications for this visit.    Family History  Problem Relation Age of Onset   Pancreatic cancer Mother    Diabetes Mother    Cancer Mother        pancreatic   Hypertension Paternal Grandmother    COPD Paternal Grandmother    Stroke Paternal Grandfather    Epilepsy Brother     Review of Systems  All other systems reviewed and are negative.   Exam:   BP 126/80 (BP Location: Right Arm, Patient Position: Sitting, Cuff Size: Large)   Ht 6\' 2"  (1.88 m)   Wt (!) 429 lb (194.6 kg)   LMP 03/21/2022   BMI 55.08 kg/m     General appearance: alert, cooperative and appears stated age Head: normocephalic, without obvious abnormality, atraumatic Neck: no adenopathy, supple, symmetrical, trachea midline and thyroid normal to inspection and palpation Lungs: clear to auscultation bilaterally Breasts: normal appearance, no masses or tenderness, No nipple retraction or dimpling, No nipple discharge or bleeding, No axillary adenopathy Heart: regular rate and rhythm Abdomen: soft, non-tender; no masses, no organomegaly Extremities: extremities normal,  atraumatic, no cyanosis or edema Skin: skin color, texture, turgor normal. No rashes or lesions Lymph nodes: cervical, supraclavicular, and axillary nodes normal. Neurologic: grossly normal  Pelvic: External genitalia:  no lesions              No abnormal inguinal nodes palpated.              Urethra:  normal appearing urethra with no masses, tenderness or lesions              Bartholins and Skenes: normal                 Vagina: normal appearing vagina with normal color and discharge, no lesions              Cervix: no lesions              Pap  taken: {yes no:314532} Bimanual Exam:  Uterus:  normal size, contour, position, consistency, mobility, non-tender              Adnexa: no mass, fullness, tenderness              Rectal exam: {yes no:314532}.  Confirms.              Anus:  normal sphincter tone, no lesions  Chaperone was present for exam:  ***  Assessment:   Well woman visit with gynecologic exam. On Micronor for cycle control and probable anovulatory cycles. PCOS. Migraine without aura. Elevated hemoglobin A1C. Asthma.  Hirsutism.   Plan: Mammogram screening discussed. Self breast awareness reviewed. Pap and HR HPV 2024. Guidelines for Calcium, Vitamin D, regular exercise program including cardiovascular and weight bearing exercise. We discussed Micronor, cyclic Provera, Nexplanon, and Mirena.  She will continue with Micronor.  Will check testosterone  today.  Anticipated Rx for spironolactone 200 mg daily with FU in 3 months,. Follow up annually and prn.   After visit summary provided.

## 2022-04-09 LAB — TESTOS,TOTAL,FREE AND SHBG (FEMALE)
Free Testosterone: 7.2 pg/mL — ABNORMAL HIGH (ref 0.1–6.4)
Sex Hormone Binding: 56.4 nmol/L (ref 17–124)
Testosterone, Total, LC-MS-MS: 75 ng/dL — ABNORMAL HIGH (ref 2–45)

## 2022-04-11 ENCOUNTER — Other Ambulatory Visit: Payer: Self-pay | Admitting: Obstetrics and Gynecology

## 2022-04-11 MED ORDER — SPIRONOLACTONE 100 MG PO TABS: 100.0000 mg | ORAL_TABLET | Freq: Every day | ORAL | 2 refills | Status: DC

## 2022-05-02 NOTE — Progress Notes (Signed)
HPI: [name] is a 36 y.o. female with past medical history significant for hyperlipidemia, prediabetes, asthma, and BMI of 55 here today for her routine physical.  Last CPE: 05/02/21 She has been engaging in stretching exercises, to maintain some level of fitness but no other physical activity. Hoping that the gym in her apartment complex eventually opens. She has been making efforts to improve her diet, consuming vegetables daily and incorporating healthy meals such as overnight oats with chia seeds, blueberries, and flaxseed for breakfast. However, she admits to struggling with maintaining a healthy diet for dinner.  Immunization History  Administered Date(s) Administered   Influenza,inj,Quad PF,6+ Mos 02/03/2019, 03/29/2021, 05/04/2022   Influenza-Unspecified 03/29/2015, 02/07/2017   Moderna Sars-Covid-2 Vaccination 08/28/2019, 09/25/2019   Tdap 12/05/2016   Tetanus 05/28/2006   Health Maintenance  Topic Date Due   COVID-19 Vaccine (3 - 2023-24 season) 05/20/2022 (Originally 01/26/2022)   PAP SMEAR-Modifier  12/20/2022   DTaP/Tdap/Td (2 - Td or Tdap) 12/06/2026   INFLUENZA VACCINE  Completed   Hepatitis C Screening  Completed   HIV Screening  Completed   HPV VACCINES  Aged Out   She follows with gynecology regularly, last seen by Dr. Ricki MillerAmundson on 04/04/2022 Hirsutism: She was started on spironolactone 25 mg, she is supposed to be taking it twice daily past taking it once daily. LMP 03/21/22. She is not sexually active.   Sinus tachycardia: She has been experiencing stress and intermittent heart palpitations, but the palpitations have ceased. She reports sleeping an average of six hours per night, with snoring but no indication of sleep apnea.  Hyperlipidemia: Currently she is on nonpharmacologic treatment. Lab Results  Component Value Date   CHOL 194 05/02/2021   HDL 46.30 05/02/2021   LDLCALC 131 (H) 05/02/2021   TRIG 83.0 05/02/2021   CHOLHDL 4  05/02/2021   Prediabetes: Negative for polyphagia, polydipsia, polyuria. Lab Results  Component Value Date   HGBA1C 5.9 05/02/2021   Review of Systems  Constitutional:  Positive for fatigue. Negative for activity change, appetite change and fever.  HENT:  Negative for hearing loss, mouth sores, sore throat and trouble swallowing.   Eyes:  Negative for redness and visual disturbance.  Respiratory:  Negative for cough, shortness of breath and wheezing.   Cardiovascular:  Negative for chest pain and leg swelling.  Gastrointestinal:  Negative for abdominal pain, nausea and vomiting.       No changes in bowel habits.  Endocrine: Negative for cold intolerance, heat intolerance, polydipsia, polyphagia and polyuria.  Genitourinary:  Negative for decreased urine volume, dysuria, hematuria, vaginal bleeding and vaginal discharge.  Musculoskeletal:  Positive for arthralgias. Negative for gait problem and myalgias.  Skin:  Negative for color change and rash.  Allergic/Immunologic: Positive for environmental allergies.  Neurological:  Negative for seizures, syncope, weakness and headaches.  Hematological:  Negative for adenopathy. Does not bruise/bleed easily.  Psychiatric/Behavioral:  Positive for sleep disturbance (Difficulty falling asleep.). Negative for confusion. The patient is not nervous/anxious.   All other systems reviewed and are negative.  Current Outpatient Medications on File Prior to Visit  Medication Sig Dispense Refill   albuterol (PROVENTIL HFA;VENTOLIN HFA) 108 (90 Base) MCG/ACT inhaler Inhale 2 puffs into the lungs every 6 (six) hours as needed for wheezing or shortness of breath. 1 Inhaler 0   clotrimazole (LOTRIMIN) 1 % cream Apply 1 application topically 2 (two) times daily. Use as needed. 30 g 1   norethindrone (HEATHER) 0.35 MG tablet Take 1 tablet (0.35  mg total) by mouth daily. 84 tablet 3   spironolactone (ALDACTONE) 100 MG tablet Take 1 tablet (100 mg total) by mouth  daily. After one week, take 1 tablet (100 mg) by mouth twice daily. 60 tablet 2   No current facility-administered medications on file prior to visit.   Past Medical History:  Diagnosis Date   Asthma    Elevated hemoglobin A1c June 2017   Elevated liver function tests June 2017   Migraines    without aura   Past Surgical History:  Procedure Laterality Date   ORIF ANKLE FRACTURE Right 04/26/2020   Procedure: OPEN REDUCTION INTERNAL FIXATION (ORIF) ANKLE FRACTURE WITH SYNDESYNOTIC REPAIR;  Surgeon: Teryl Lucy, MD;  Location: [facility];  Service: Orthopedics;  Laterality: Right;   WISDOM TOOTH EXTRACTION     Allergies  Allergen Reactions   Sulfa Antibiotics Itching and Other (See Comments)    Tingly feeling in eye, redness    Coconut (Cocos Nucifera) Itching and Swelling    Swelling of the throat --- very mild reaction -- just by mouth    Family History  Problem Relation Age of Onset   Pancreatic cancer Mother    Diabetes Mother    Cancer Mother        pancreatic   Hypertension Paternal Grandmother    COPD Paternal Grandmother    Stroke Paternal Grandfather    Epilepsy Brother    Social History   Socioeconomic History   Marital status: Single    Spouse name: Not on file   Number of children: Not on file   Years of education: Not on file   Highest education level: Not on file  Occupational History   Not on file  Tobacco Use   Smoking status: Never   Smokeless tobacco: Never  Vaping Use   Vaping Use: Never used  Substance and Sexual Activity   Alcohol use: Not Currently    Comment: 1 drink 2-3x/year   Drug use: No   Sexual activity: Never    Partners: Male    Birth control/protection: Abstinence, Pill    Comment: Heather  Other Topics Concern   Not on file  Social History Narrative   Not on file   Social Determinants of Health   Financial Resource Strain: Not on file  Food Insecurity: Not on file  Transportation Needs: Not on file  Physical Activity:  Not on file  Stress: Not on file  Social Connections: Not on file   Vitals:   05/04/22 0941  BP: 130/80  Pulse: 100  Resp: 12  Temp: 98.2 F (36.8 C)  SpO2: 98%  Body mass index is 55.22 kg/m. Wt Readings from Last 3 Encounters:  05/04/22 (!) 430 lb 2 oz (195.1 kg)  04/04/22 (!) 429 lb (194.6 kg)  05/02/21 (!) 420 lb 6 oz (190.7 kg)  Physical Exam Vitals and nursing note reviewed.  Constitutional:      General: She is not in acute distress.    Appearance: She is well-developed.  HENT:     Head: Normocephalic and atraumatic.     Right Ear: Hearing, tympanic membrane, ear canal and external ear normal.     Left Ear: Hearing, tympanic membrane, ear canal and external ear normal.     Mouth/Throat:     Mouth: Mucous membranes are moist.     Pharynx: Oropharynx is clear. Uvula midline.  Eyes:     Extraocular Movements: Extraocular movements intact.     Conjunctiva/sclera: Conjunctivae normal.  Pupils: Pupils are equal, round, and reactive to light.  Neck:     Thyroid: No thyromegaly.     Trachea: No tracheal deviation.  Cardiovascular:     Rate and Rhythm: Normal rate and regular rhythm.     Pulses:          Dorsalis pedis pulses are 2+ on the right side and 2+ on the left side.     Heart sounds: No murmur heard. Pulmonary:     Effort: Pulmonary effort is normal. No respiratory distress.     Breath sounds: Normal breath sounds.  Abdominal:     Palpations: Abdomen is soft. There is no hepatomegaly or mass.     Tenderness: There is no abdominal tenderness.  Genitourinary:    Comments: Deferred to gyn. Musculoskeletal:     Comments: No major deformity or signs of synovitis appreciated.  Lymphadenopathy:     Cervical: No cervical adenopathy.     Upper Body:     Right upper body: No supraclavicular adenopathy.     Left upper body: No supraclavicular adenopathy.  Skin:    General: Skin is warm.     Findings: No erythema or rash.  Neurological:     General: No focal  deficit present.     Mental Status: She is alert and oriented to person, place, and time.     Cranial Nerves: No cranial nerve deficit.     Coordination: Coordination normal.     Gait: Gait normal.     Deep Tendon Reflexes:     Reflex Scores:      Bicep reflexes are 2+ on the right side and 2+ on the left side.      Patellar reflexes are 2+ on the right side and 2+ on the left side. Psychiatric:        Mood and Affect: Mood and affect normal.   ASSESSMENT AND PLAN: Ms. Dimonique Bourdeau was here today annual physical examination.  Orders Placed This Encounter  Procedures   Flu Vaccine QUAD 70mo+IM (Fluarix, Fluzone & Alfiuria Quad PF)   Basic metabolic panel   Hemoglobin A1c   Lipid panel   CBC   Lab Results  Component Value Date   WBC 10.2 04/27/2020   HGB 13.2 04/27/2020   HCT 40.8 04/27/2020   MCV 90.7 04/27/2020   PLT 354 04/27/2020   Lab Results  Component Value Date   CREATININE 0.71 05/02/2021   BUN 10 05/02/2021   NA 139 05/02/2021   K 3.6 05/02/2021   CL 105 05/02/2021   CO2 25 05/02/2021   Lab Results  Component Value Date   CHOL 194 05/02/2021   HDL 46.30 05/02/2021   LDLCALC 131 (H) 05/02/2021   TRIG 83.0 05/02/2021   CHOLHDL 4 05/02/2021   Lab Results  Component Value Date   HGBA1C 5.9 05/02/2021   Routine general medical examination at a health care facility Assessment & Plan: We discussed the importance of regular physical activity and healthy diet for prevention of chronic illness and/or complications. Preventive guidelines reviewed. Vaccination Today. Continue her female preventive care with her gynecologist. Next CPE in a year.   Hyperlipidemia, unspecified hyperlipidemia type Assessment & Plan: Low-fat diet recommended for now. Further recommendation will be given according to lipid panel results.  Orders: -     Lipid panel; Future  Prediabetes Assessment & Plan: Consistency with a healthy lifestyle encouraged for  diabetes prevention. Further recommendation will be given according to A1c result.  Orders: -  Basic metabolic panel; Future -     Hemoglobin A1c; Future  Need for influenza vaccination -     Flu Vaccine QUAD 46mo+IM (Fluarix, Fluzone & Alfiuria Quad PF)  Sinus tachycardia Assessment & Plan: Palpitations have improved. Today initially HR was 126/min but improved at the end of the visit.  Instructed to monitor HR   Orders: -     CBC; Future  Obesity, morbid, BMI 50 or higher (HCC) Assessment & Plan: Gained about 10 Lb since her last physical. She understands the benefits of wt loss as well as adverse effects of obesity. Consistency with healthy diet and physical activity encouraged. Making positive life style changes, small steps at the time. Meals prep has helped in the past.   Return in 1 year (on 05/05/2023) for CPE, chronic problems.  Vickie Cushman G. [place], MD  [facility] [place] office.

## 2022-05-04 ENCOUNTER — Ambulatory Visit (INDEPENDENT_AMBULATORY_CARE_PROVIDER_SITE_OTHER): Payer: Managed Care, Other (non HMO) | Admitting: Family Medicine

## 2022-05-04 ENCOUNTER — Other Ambulatory Visit: Payer: Self-pay

## 2022-05-04 ENCOUNTER — Encounter: Payer: Self-pay | Admitting: Family Medicine

## 2022-05-04 VITALS — BP 130/80 | HR 100 | Temp 98.2°F | Resp 12 | Ht 74.0 in | Wt >= 6400 oz

## 2022-05-04 DIAGNOSIS — R000 Tachycardia, unspecified: Secondary | ICD-10-CM

## 2022-05-04 DIAGNOSIS — R7303 Prediabetes: Secondary | ICD-10-CM | POA: Insufficient documentation

## 2022-05-04 DIAGNOSIS — Z0000 Encounter for general adult medical examination without abnormal findings: Secondary | ICD-10-CM

## 2022-05-04 DIAGNOSIS — Z23 Encounter for immunization: Secondary | ICD-10-CM

## 2022-05-04 DIAGNOSIS — E785 Hyperlipidemia, unspecified: Secondary | ICD-10-CM | POA: Diagnosis not present

## 2022-05-04 LAB — LIPID PANEL
Cholesterol: 220 mg/dL — ABNORMAL HIGH (ref 0–200)
HDL: 51.7 mg/dL (ref >39.00)
LDL Cholesterol: 150 mg/dL — ABNORMAL HIGH (ref 0–99)
NonHDL: 168.45
Total CHOL/HDL Ratio: 4
Triglycerides: 92 mg/dL (ref 0.0–149.0)
VLDL: 18.4 mg/dL (ref 0.0–40.0)

## 2022-05-04 LAB — CBC
HCT: 43.7 % (ref 36.0–46.0)
Hemoglobin: 14.5 g/dL (ref 12.0–15.0)
MCHC: 33.2 g/dL (ref 30.0–36.0)
MCV: 87.3 fl (ref 78.0–100.0)
Platelets: 394 10*3/uL (ref 150.0–400.0)
RBC: 5.01 Mil/uL (ref 3.87–5.11)
RDW: 14.4 % (ref 11.5–15.5)
WBC: 8.7 10*3/uL (ref 4.0–10.5)

## 2022-05-04 LAB — BASIC METABOLIC PANEL
BUN: 9 mg/dL (ref 6–23)
CO2: 30 mEq/L (ref 19–32)
Calcium: 10 mg/dL (ref 8.4–10.5)
Chloride: 103 mEq/L (ref 96–112)
Creatinine, Ser: 0.8 mg/dL (ref 0.40–1.20)
GFR: 94.29 mL/min (ref >60.00)
Glucose, Bld: 88 mg/dL (ref 70–99)
Potassium: 3.9 mEq/L (ref 3.5–5.1)
Sodium: 138 mEq/L (ref 135–145)

## 2022-05-04 LAB — HEMOGLOBIN A1C: Hgb A1c MFr Bld: 6.2 % (ref 4.6–6.5)

## 2022-05-04 NOTE — Patient Instructions (Addendum)
A few things to remember from today's visit:  Routine general medical examination at a health care facility  Hyperlipidemia, unspecified hyperlipidemia type  Prediabetes  Need for influenza vaccination - Plan: Flu Vaccine QUAD 81mo+IM (Fluarix, Fluzone & Alfiuria Quad PF)  Do not use My Chart to request refills or for acute issues that need immediate attention. If you send a my chart message, it may take a few days to be addressed, specially if I am not in the office.  Please be sure medication list is accurate. If a new problem present, please set up appointment sooner than planned today.  Health Maintenance, Female Adopting a healthy lifestyle and getting preventive care are important in promoting health and wellness. Ask your health care provider about: The right schedule for you to have regular tests and exams. Things you can do on your own to prevent diseases and keep yourself healthy. What should I know about diet, weight, and exercise? Eat a healthy diet  Eat a diet that includes plenty of vegetables, fruits, low-fat dairy products, and lean protein. Do not eat a lot of foods that are high in solid fats, added sugars, or sodium. Maintain a healthy weight Body mass index (BMI) is used to identify weight problems. It estimates body fat based on height and weight. Your health care provider can help determine your BMI and help you achieve or maintain a healthy weight. Get regular exercise Get regular exercise. This is one of the most important things you can do for your health. Most adults should: Exercise for at least 150 minutes each week. The exercise should increase your heart rate and make you sweat (moderate-intensity exercise). Do strengthening exercises at least twice a week. This is in addition to the moderate-intensity exercise. Spend less time sitting. Even light physical activity can be beneficial. Watch cholesterol and blood lipids Have your blood tested for lipids and  cholesterol at 37 years of age, then have this test every 5 years. Have your cholesterol levels checked more often if: Your lipid or cholesterol levels are high. You are older than 37 years of age. You are at high risk for heart disease. What should I know about cancer screening? Depending on your health history and family history, you may need to have cancer screening at various ages. This may include screening for: Breast cancer. Cervical cancer. Colorectal cancer. Skin cancer. Lung cancer. What should I know about heart disease, diabetes, and high blood pressure? Blood pressure and heart disease High blood pressure causes heart disease and increases the risk of stroke. This is more likely to develop in people who have high blood pressure readings or are overweight. Have your blood pressure checked: Every 3-5 years if you are 37-64 years of age. Every year if you are 72 years old or older. Diabetes Have regular diabetes screenings. This checks your fasting blood sugar level. Have the screening done: Once every three years after age 45 if you are at a normal weight and have a low risk for diabetes. More often and at a younger age if you are overweight or have a high risk for diabetes. What should I know about preventing infection? Hepatitis B If you have a higher risk for hepatitis B, you should be screened for this virus. Talk with your health care provider to find out if you are at risk for hepatitis B infection. Hepatitis C Testing is recommended for: Everyone born from [birth year] through 1966. Anyone with known risk factors for hepatitis C. Sexually transmitted  infections (STIs) Get screened for STIs, including gonorrhea and chlamydia, if: You are sexually active and are younger than 37 years of age. You are older than 37 years of age and your health care provider tells you that you are at risk for this type of infection. Your sexual activity has changed since you were last screened,  and you are at increased risk for chlamydia or gonorrhea. Ask your health care provider if you are at risk. Ask your health care provider about whether you are at high risk for HIV. Your health care provider may recommend a prescription medicine to help prevent HIV infection. If you choose to take medicine to prevent HIV, you should first get tested for HIV. You should then be tested every 3 months for as long as you are taking the medicine. Pregnancy If you are about to stop having your period (premenopausal) and you may become pregnant, seek counseling before you get pregnant. Take 400 to 800 micrograms (mcg) of folic acid every day if you become pregnant. Ask for birth control (contraception) if you want to prevent pregnancy. Osteoporosis and menopause Osteoporosis is a disease in which the bones lose minerals and strength with aging. This can result in bone fractures. If you are 25 years old or older, or if you are at risk for osteoporosis and fractures, ask your health care provider if you should: Be screened for bone loss. Take a calcium or vitamin D supplement to lower your risk of fractures. Be given hormone replacement therapy (HRT) to treat symptoms of menopause. Follow these instructions at home: Alcohol use Do not drink alcohol if: Your health care provider tells you not to drink. You are pregnant, may be pregnant, or are planning to become pregnant. If you drink alcohol: Limit how much you have to: 0-1 drink a day. Know how much alcohol is in your drink. In the [place] one drink equals one 12 oz bottle of beer (355 mL), one 5 oz glass of wine (148 mL), or one 1 oz glass of hard liquor (44 mL). Lifestyle Do not use any products that contain nicotine or tobacco. These products include cigarettes, chewing tobacco, and vaping devices, such as e-cigarettes. If you need help quitting, ask your health care provider. Do not use street drugs. Do not share needles. Ask your health care  provider for help if you need support or information about quitting drugs. General instructions Schedule regular health, dental, and eye exams. Stay current with your vaccines. Tell your health care provider if: You often feel depressed. You have ever been abused or do not feel safe at home. Summary Adopting a healthy lifestyle and getting preventive care are important in promoting health and wellness. Follow your health care provider's instructions about healthy diet, exercising, and getting tested or screened for diseases. Follow your health care provider's instructions on monitoring your cholesterol and blood pressure. This information is not intended to replace advice given to you by your health care provider. Make sure you discuss any questions you have with your health care provider. Document Revised: 10/03/2020 Document Reviewed: 10/03/2020 Elsevier Patient Education  2023 [employer].

## 2022-05-04 NOTE — Assessment & Plan Note (Signed)
Consistency with a healthy lifestyle encouraged for diabetes prevention. Further recommendation will be given according to A1c result.

## 2022-05-04 NOTE — Assessment & Plan Note (Signed)
Palpitations have improved. Today initially HR was 126/min but improved at the end of the visit.  Instructed to monitor HR

## 2022-05-04 NOTE — Assessment & Plan Note (Addendum)
Gained about 10 Lb since her last physical. She understands the benefits of wt loss as well as adverse effects of obesity. Consistency with healthy diet and physical activity encouraged. Making positive life style changes, small steps at the time. Meals prep has helped in the past.

## 2022-05-04 NOTE — Assessment & Plan Note (Signed)
Low-fat diet recommended for now. Further recommendation will be given according to lipid panel results.

## 2022-05-04 NOTE — Assessment & Plan Note (Signed)
We discussed the importance of regular physical activity and healthy diet for prevention of chronic illness and/or complications. Preventive guidelines reviewed. Vaccination Today. Continue her female preventive care with her gynecologist. Next CPE in a year.

## 2022-07-18 NOTE — Progress Notes (Signed)
GYNECOLOGY  VISIT   HPI: 37 y.o.   Single  African American  female   G0P0000 with No LMP recorded. (Menstrual status: Oral contraceptives).   here for   3 mo f/u for RX.  Started spironolactone for hirsutism.  Testosterone total 75 and free testosterone 7.2 on 04/04/22.  She felt clumsy taking 200 mg daily, so she is taking 100 mg daily. Taking it in the morning.   She notices her facial hair growth is slower.  She has never been shaving.  No increased hair in any other area of the body.   She has been on birth control pills for many years due to irregular menses.  Her pelvic US 11/18/14 was normal.   She has probably PCOS.  Considering future pregnancy.  Normal TSH in the past.   Has elevated A1C and elevated cholesterol followed through PCP.  GYNECOLOGIC HISTORY: No LMP recorded. (Menstrual status: Oral contraceptives). Contraception:  POPs. Menopausal hormone therapy:  n/a Last mammogram:  n/a Last pap smear:   12/19/17 Neg:Neg HR HPV, 10-20-14 Neg         OB History     Gravida  0   Para  0   Term  0   Preterm  0   AB  0   Living  0      SAB  0   IAB  0   Ectopic  0   Multiple  0   Live Births                 Patient Active Problem List   Diagnosis Date Noted   Hyperlipidemia 05/04/2022   Prediabetes 05/04/2022   Routine general medical examination at a health care facility 05/04/2022   Sinus tachycardia 03/12/2021   Closed fracture dislocation of right ankle 04/26/2020   Closed right ankle fracture 04/25/2020   Ankle pain, chronic 01/30/2017   Obesity, morbid, BMI 50 or higher ([place]) 01/30/2017    Past Medical History:  Diagnosis Date   Asthma    Elevated hemoglobin A1c June 2017   Elevated liver function tests June 2017   Migraines    without aura    Past Surgical History:  Procedure Laterality Date   ORIF ANKLE FRACTURE Right 04/26/2020   Procedure: OPEN REDUCTION INTERNAL FIXATION (ORIF) ANKLE FRACTURE WITH SYNDESYNOTIC  REPAIR;  Surgeon: Marchia Bond, MD;  Location: [facility];  Service: Orthopedics;  Laterality: Right;   WISDOM TOOTH EXTRACTION      Current Outpatient Medications  Medication Sig Dispense Refill   albuterol (PROVENTIL HFA;VENTOLIN HFA) 108 (90 Base) MCG/ACT inhaler Inhale 2 puffs into the lungs every 6 (six) hours as needed for wheezing or shortness of breath. 1 Inhaler 0   clotrimazole (LOTRIMIN) 1 % cream Apply 1 application topically 2 (two) times daily. Use as needed. 30 g 1   norethindrone (HEATHER) 0.35 MG tablet Take 1 tablet (0.35 mg total) by mouth daily. 84 tablet 3   Prenatal MV-Min-Fe Fum-FA-DHA (PRENATAL/FOLIC ACID+DHA) 123XX123 MG CAPS Take 1 capsule by mouth daily.     spironolactone (ALDACTONE) 100 MG tablet Take 1 tablet (100 mg total) by mouth daily. After one week, take 1 tablet (100 mg) by mouth twice daily. 60 tablet 2   No current facility-administered medications for this visit.     ALLERGIES: Sulfa antibiotics and Coconut (cocos nucifera)  Family History  Problem Relation Age of Onset   Pancreatic cancer Mother    Diabetes Mother    Cancer Mother  pancreatic   Hypertension Paternal Grandmother    COPD Paternal Grandmother    Stroke Paternal Grandfather    Epilepsy Brother     Social History   Socioeconomic History   Marital status: Single    Spouse name: Not on file   Number of children: Not on file   Years of education: Not on file   Highest education level: Not on file  Occupational History   Not on file  Tobacco Use   Smoking status: Never   Smokeless tobacco: Never  Vaping Use   Vaping Use: Never used  Substance and Sexual Activity   Alcohol use: Not Currently    Comment: 1 drink 2-3x/year   Drug use: No   Sexual activity: Never    Partners: Male    Birth control/protection: Abstinence, Pill    Comment: Heather  Other Topics Concern   Not on file  Social History Narrative   Not on file   Social Determinants of Health    Financial Resource Strain: Not on file  Food Insecurity: Not on file  Transportation Needs: Not on file  Physical Activity: Not on file  Stress: Not on file  Social Connections: Not on file  Intimate Partner Violence: Not on file    Review of Systems  All other systems reviewed and are negative.   PHYSICAL EXAMINATION:    BP 118/76 (BP Location: Left Arm, Patient Position: Sitting, Cuff Size: Large)   Pulse 78   Ht '6\' 2"'$  (1.88 m)   Wt (!) 431 lb (195.5 kg)   SpO2 94%   BMI 55.34 kg/m     General appearance: alert, cooperative and appears stated age   ASSESSMENT  Mild hirsutism.  Improved on Spironolactone 100 mg.  Elevated testosterone.  Irregular cycles controlled on progesterone only pills.  Probably PCOS.  PLAN  Will try to increase Spironolactone to 200 mg and take at hs.  Disp: #180, RF 2. (She has other refill at home already). Return to check BMP and prolactin.  Lab not able to draw blood today.  We discussed PCOS today.  She will continue on Micronor.  I encouraged control of blood sugar and cholesterol.  FU for annual exam in November, 2024 and prn.    An After Visit Summary was printed and given to the patient.  29 min  total time was spent for this patient encounter, including preparation, face-to-face counseling with the patient, coordination of care, and documentation of the encounter.

## 2022-08-01 ENCOUNTER — Encounter: Payer: Self-pay | Admitting: Obstetrics and Gynecology

## 2022-08-01 ENCOUNTER — Ambulatory Visit: Payer: Managed Care, Other (non HMO) | Admitting: Obstetrics and Gynecology

## 2022-08-01 VITALS — BP 118/76 | HR 78 | Ht 74.0 in | Wt >= 6400 oz

## 2022-08-01 DIAGNOSIS — Z5181 Encounter for therapeutic drug level monitoring: Secondary | ICD-10-CM

## 2022-08-01 DIAGNOSIS — L680 Hirsutism: Secondary | ICD-10-CM | POA: Diagnosis not present

## 2022-08-01 MED ORDER — SPIRONOLACTONE 100 MG PO TABS: ORAL_TABLET | ORAL | 2 refills | Status: AC

## 2022-08-02 ENCOUNTER — Other Ambulatory Visit: Payer: Managed Care, Other (non HMO)

## 2022-08-03 ENCOUNTER — Other Ambulatory Visit: Payer: Managed Care, Other (non HMO)

## 2022-08-03 DIAGNOSIS — L680 Hirsutism: Secondary | ICD-10-CM

## 2022-08-03 DIAGNOSIS — Z5181 Encounter for therapeutic drug level monitoring: Secondary | ICD-10-CM

## 2022-08-04 LAB — BASIC METABOLIC PANEL
BUN: 9 mg/dL (ref 7–25)
CO2: 20 mmol/L (ref 20–32)
Calcium: 9.3 mg/dL (ref 8.6–10.2)
Chloride: 105 mmol/L (ref 98–110)
Creat: 0.71 mg/dL (ref 0.50–0.97)
Glucose, Bld: 92 mg/dL (ref 65–99)
Potassium: 4 mmol/L (ref 3.5–5.3)
Sodium: 138 mmol/L (ref 135–146)

## 2022-08-04 LAB — PROLACTIN: Prolactin: 13 ng/mL

## 2022-08-06 ENCOUNTER — Other Ambulatory Visit: Payer: Self-pay | Admitting: Obstetrics and Gynecology

## 2022-08-06 NOTE — Telephone Encounter (Signed)
Last AEX 04/04/2022--scheduled for 04/17/2023.  Rx sent on 08/01/2022 for #180 w/ 2 refills to alternate pharmacy. Will contact pt to confirm it was correct pharmacy or if needs it changed.   Pt reported that [facility] and is happy to leave it where it is at.   Will refuse this request for now and route to provider for final review.

## 2023-02-18 ENCOUNTER — Other Ambulatory Visit: Payer: Self-pay | Admitting: Obstetrics and Gynecology

## 2023-02-18 DIAGNOSIS — Z3040 Encounter for surveillance of contraceptives, unspecified: Secondary | ICD-10-CM

## 2023-02-19 NOTE — Telephone Encounter (Signed)
Med refill request: Vickie Mejia 0.35mg  Last AEX: 04/04/22 Next AEX: 04/17/23 Last MMG (if hormonal med) n/a Refill sent to provider for approval.

## 2023-04-03 NOTE — Progress Notes (Deleted)
[name] here for annual exam.    PCP: [place], Betty G, MD   No LMP recorded. (Menstrual status: Oral contraceptives).           Sexually active: No.  The current method of family planning is OCP.    Menopausal hormone therapy:  n/a Exercising: {yes no:314532}  {types:19826} Smoker:  no  OB History  Gravida Para Term Preterm AB Living  0 0 0 0 0 0  SAB IAB Ectopic Multiple Live Births  0 0 0 0       HEALTH MAINTENANCE:    Component Value Date/Time   DIAGPAP  12/19/2017 0000    NEGATIVE FOR INTRAEPITHELIAL LESIONS OR MALIGNANCY.   ADEQPAP  12/19/2017 0000    Satisfactory for evaluation  endocervical/transformation zone component PRESENT.    History of abnormal Pap or positive HPV:  no Mammogram: n/a Colonoscopy:  n/a Bone Density:  n/a  Result  n/a   Immunization History  Administered Date(s) Administered   Influenza,inj,Quad PF,6+ Mos 02/03/2019, 03/29/2021, 05/04/2022   Influenza-Unspecified 03/29/2015, 02/07/2017   Moderna Sars-Covid-2 Vaccination 08/28/2019, 09/25/2019   Tdap 12/05/2016   Tetanus 05/28/2006      reports that she has never smoked. She has never used smokeless tobacco. She reports that she does not currently use alcohol. She reports that she does not use drugs.  Past Medical History:  Diagnosis Date   Asthma    Elevated hemoglobin A1c June 2017   Elevated liver function tests June 2017   Migraines    without aura    Past Surgical History:  Procedure Laterality Date   ORIF ANKLE FRACTURE Right 04/26/2020   Procedure: OPEN REDUCTION INTERNAL FIXATION (ORIF) ANKLE FRACTURE WITH SYNDESYNOTIC REPAIR;  Surgeon: Teryl Lucy, MD;  Location: [facility];  Service: Orthopedics;  Laterality: Right;   WISDOM TOOTH EXTRACTION      Current Outpatient Medications  Medication Sig Dispense Refill   albuterol (PROVENTIL HFA;VENTOLIN HFA) 108 (90 Base) MCG/ACT inhaler Inhale 2 puffs into the lungs every 6 (six) hours  as needed for wheezing or shortness of breath. 1 Inhaler 0   clotrimazole (LOTRIMIN) 1 % cream Apply 1 application topically 2 (two) times daily. Use as needed. 30 g 1   norethindrone (HEATHER) 0.35 MG tablet TAKE 1 TABLET DAILY 84 tablet 0   Prenatal MV-Min-Fe Fum-FA-DHA (PRENATAL/FOLIC ACID+DHA) 27-0.8-200 MG CAPS Take 1 capsule by mouth daily.     spironolactone (ALDACTONE) 100 MG tablet Take 2 tablets (200 mg) by mouth at bedtime. 180 tablet 2   No current facility-administered medications for this visit.    ALLERGIES: Sulfa antibiotics and Coconut (cocos nucifera)  Family History  Problem Relation Age of Onset   Pancreatic cancer Mother    Diabetes Mother    Cancer Mother        pancreatic   Hypertension Paternal Grandmother    COPD Paternal Grandmother    Stroke Paternal Grandfather    Epilepsy Brother     Review of Systems  PHYSICAL EXAM:  There were no vitals taken for this visit.    General appearance: alert, cooperative and appears stated age Head: normocephalic, without obvious abnormality, atraumatic Neck: no adenopathy, supple, symmetrical, trachea midline and thyroid normal to inspection and palpation Lungs: clear to auscultation bilaterally Breasts: normal appearance, no masses or tenderness, No nipple retraction or dimpling, No nipple discharge or bleeding, No axillary adenopathy Heart: regular rate and rhythm Abdomen: soft, non-tender; no masses, no organomegaly Extremities:  extremities normal, atraumatic, no cyanosis or edema Skin: skin color, texture, turgor normal. No rashes or lesions Lymph nodes: cervical, supraclavicular, and axillary nodes normal. Neurologic: grossly normal  Pelvic: External genitalia:  no lesions              No abnormal inguinal nodes palpated.              Urethra:  normal appearing urethra with no masses, tenderness or lesions              Bartholins and Skenes: normal                 Vagina: normal appearing vagina with normal  color and discharge, no lesions              Cervix: no lesions              Pap taken: {yes no:314532} Bimanual Exam:  Uterus:  normal size, contour, position, consistency, mobility, non-tender              Adnexa: no mass, fullness, tenderness              Rectal exam: {yes no:314532}.  Confirms.              Anus:  normal sphincter tone, no lesions  Chaperone was present for exam:  {BSCHAPERONE:31226::"Lai Hendriks F, CMA"}  {LABS (Optional):23779}  ASSESSMENT: Well woman visit with gynecologic exam  ***  PLAN: Mammogram screening discussed. Self breast awareness reviewed. Pap and HRV collected:  {yes no:314532} Guidelines for Calcium, Vitamin D, regular exercise program including cardiovascular and weight bearing exercise. Medication refills:  *** Follow up:  ***    Additional counseling given.  {yes [id number]. ***  total time was spent for this patient encounter, including preparation, face-to-face counseling with the patient, coordination of care, and documentation of the encounter in addition to doing the well woman visit with gynecologic exam.   An After Visit Summary was provided to the patient.

## 2023-04-17 ENCOUNTER — Ambulatory Visit: Payer: Managed Care, Other (non HMO) | Admitting: Obstetrics and Gynecology

## 2023-05-13 ENCOUNTER — Other Ambulatory Visit: Payer: Self-pay | Admitting: Obstetrics and Gynecology

## 2023-05-13 DIAGNOSIS — Z3040 Encounter for surveillance of contraceptives, unspecified: Secondary | ICD-10-CM

## 2023-05-13 NOTE — Telephone Encounter (Signed)
Medication refill request: heather 0.35mg  Last AEX:  04-04-22 Next AEX: not scheduled. Message sent to scheduling department to call her to schedule Last MMG (if hormonal medication request): none Refill authorized: please approver or deny as appropriate
